# Patient Record
Sex: Female | Born: 1970 | Race: Black or African American | Hispanic: No | Marital: Married | Smoking: Never smoker
Health system: Southern US, Community
[De-identification: ages and names within clinical notes are randomized; demographics above are authoritative.]

## PROBLEM LIST (undated history)

## (undated) DIAGNOSIS — M79643 Pain in unspecified hand: Secondary | ICD-10-CM

## (undated) DIAGNOSIS — M311 Thrombotic microangiopathy: Secondary | ICD-10-CM

## (undated) DIAGNOSIS — E119 Type 2 diabetes mellitus without complications: Secondary | ICD-10-CM

## (undated) DIAGNOSIS — J45909 Unspecified asthma, uncomplicated: Secondary | ICD-10-CM

## (undated) DIAGNOSIS — I639 Cerebral infarction, unspecified: Secondary | ICD-10-CM

## (undated) DIAGNOSIS — R569 Unspecified convulsions: Secondary | ICD-10-CM

## (undated) DIAGNOSIS — I10 Essential (primary) hypertension: Secondary | ICD-10-CM

## (undated) DIAGNOSIS — N289 Disorder of kidney and ureter, unspecified: Secondary | ICD-10-CM

## (undated) DIAGNOSIS — M3119 Other thrombotic microangiopathy: Secondary | ICD-10-CM

## (undated) HISTORY — PX: TUBAL LIGATION: SHX77

## (undated) HISTORY — PX: TONSILLECTOMY: SUR1361

---

## 2002-10-18 ENCOUNTER — Emergency Department (HOSPITAL_COMMUNITY): Admission: EM | Admit: 2002-10-18 | Discharge: 2002-10-18 | Payer: Self-pay | Admitting: Emergency Medicine

## 2007-12-01 ENCOUNTER — Ambulatory Visit (HOSPITAL_COMMUNITY): Admission: EM | Admit: 2007-12-01 | Discharge: 2007-12-01 | Payer: Self-pay | Admitting: Emergency Medicine

## 2011-02-16 ENCOUNTER — Emergency Department (HOSPITAL_BASED_OUTPATIENT_CLINIC_OR_DEPARTMENT_OTHER)
Admission: EM | Admit: 2011-02-16 | Discharge: 2011-02-16 | Disposition: A | Discharge status: Home / Self Care | Payer: Medicaid - Out of State | Attending: Emergency Medicine | Admitting: Emergency Medicine

## 2011-02-16 DIAGNOSIS — E669 Obesity, unspecified: Secondary | ICD-10-CM | POA: Insufficient documentation

## 2011-02-16 DIAGNOSIS — N390 Urinary tract infection, site not specified: Secondary | ICD-10-CM | POA: Insufficient documentation

## 2011-02-16 DIAGNOSIS — R112 Nausea with vomiting, unspecified: Secondary | ICD-10-CM | POA: Insufficient documentation

## 2011-02-16 DIAGNOSIS — Z794 Long term (current) use of insulin: Secondary | ICD-10-CM | POA: Insufficient documentation

## 2011-02-16 DIAGNOSIS — E119 Type 2 diabetes mellitus without complications: Secondary | ICD-10-CM | POA: Insufficient documentation

## 2011-02-16 DIAGNOSIS — I10 Essential (primary) hypertension: Secondary | ICD-10-CM | POA: Insufficient documentation

## 2011-02-16 DIAGNOSIS — F329 Major depressive disorder, single episode, unspecified: Secondary | ICD-10-CM | POA: Insufficient documentation

## 2011-02-16 DIAGNOSIS — K5900 Constipation, unspecified: Secondary | ICD-10-CM | POA: Insufficient documentation

## 2011-02-16 DIAGNOSIS — F3289 Other specified depressive episodes: Secondary | ICD-10-CM | POA: Insufficient documentation

## 2011-02-16 DIAGNOSIS — Z79899 Other long term (current) drug therapy: Secondary | ICD-10-CM | POA: Insufficient documentation

## 2011-02-16 DIAGNOSIS — R51 Headache: Secondary | ICD-10-CM | POA: Insufficient documentation

## 2011-02-16 LAB — URINALYSIS, ROUTINE W REFLEX MICROSCOPIC
Bilirubin Urine: NEGATIVE
Nitrite: POSITIVE — AB
Protein, ur: >300 mg/dL — AB
Urobilinogen, UA: 0.2 mg/dL (ref 0.0–1.0)

## 2011-02-16 LAB — DIFFERENTIAL
Basophils Absolute: 0 10*3/uL (ref 0.0–0.1)
Lymphocytes Relative: 32 % (ref 12–46)
Monocytes Relative: 8 % (ref 3–12)
Neutrophils Relative %: 59 % (ref 43–77)

## 2011-02-16 LAB — CBC
Hemoglobin: 10.6 g/dL — ABNORMAL LOW (ref 12.0–15.0)
MCHC: 32.2 g/dL (ref 30.0–36.0)
RBC: 5.35 MIL/uL — ABNORMAL HIGH (ref 3.87–5.11)
WBC: 10 10*3/uL (ref 4.0–10.5)

## 2011-02-16 LAB — GLUCOSE, CAPILLARY: Glucose-Capillary: 389 mg/dL — ABNORMAL HIGH (ref 70–99)

## 2011-02-16 LAB — URINE MICROSCOPIC-ADD ON

## 2011-02-16 LAB — PREGNANCY, URINE: Preg Test, Ur: NEGATIVE

## 2011-02-16 LAB — BASIC METABOLIC PANEL
CO2: 25 mEq/L (ref 19–32)
Calcium: 9.1 mg/dL (ref 8.4–10.5)
Chloride: 100 mEq/L (ref 96–112)
GFR calc Af Amer: >60 mL/min (ref >60)
Potassium: 4.3 mEq/L (ref 3.5–5.1)
Sodium: 137 mEq/L (ref 135–145)

## 2011-02-18 LAB — URINE CULTURE

## 2012-10-30 ENCOUNTER — Emergency Department (HOSPITAL_BASED_OUTPATIENT_CLINIC_OR_DEPARTMENT_OTHER): Payer: Medicaid Other

## 2012-10-30 ENCOUNTER — Encounter (HOSPITAL_BASED_OUTPATIENT_CLINIC_OR_DEPARTMENT_OTHER): Payer: Self-pay | Admitting: Family Medicine

## 2012-10-30 ENCOUNTER — Emergency Department (HOSPITAL_BASED_OUTPATIENT_CLINIC_OR_DEPARTMENT_OTHER)
Admission: EM | Admit: 2012-10-30 | Discharge: 2012-10-30 | Disposition: A | Discharge status: Home / Self Care | Payer: Medicaid Other | Attending: Emergency Medicine | Admitting: Emergency Medicine

## 2012-10-30 DIAGNOSIS — E119 Type 2 diabetes mellitus without complications: Secondary | ICD-10-CM | POA: Insufficient documentation

## 2012-10-30 DIAGNOSIS — G40909 Epilepsy, unspecified, not intractable, without status epilepticus: Secondary | ICD-10-CM | POA: Insufficient documentation

## 2012-10-30 DIAGNOSIS — J45909 Unspecified asthma, uncomplicated: Secondary | ICD-10-CM | POA: Insufficient documentation

## 2012-10-30 DIAGNOSIS — Z79899 Other long term (current) drug therapy: Secondary | ICD-10-CM | POA: Insufficient documentation

## 2012-10-30 DIAGNOSIS — M311 Thrombotic microangiopathy, unspecified: Secondary | ICD-10-CM | POA: Insufficient documentation

## 2012-10-30 DIAGNOSIS — J069 Acute upper respiratory infection, unspecified: Secondary | ICD-10-CM | POA: Insufficient documentation

## 2012-10-30 DIAGNOSIS — D509 Iron deficiency anemia, unspecified: Secondary | ICD-10-CM | POA: Insufficient documentation

## 2012-10-30 DIAGNOSIS — I10 Essential (primary) hypertension: Secondary | ICD-10-CM | POA: Insufficient documentation

## 2012-10-30 DIAGNOSIS — J40 Bronchitis, not specified as acute or chronic: Secondary | ICD-10-CM | POA: Insufficient documentation

## 2012-10-30 HISTORY — DX: Essential (primary) hypertension: I10

## 2012-10-30 HISTORY — DX: Unspecified convulsions: R56.9

## 2012-10-30 HISTORY — DX: Unspecified asthma, uncomplicated: J45.909

## 2012-10-30 HISTORY — DX: Type 2 diabetes mellitus without complications: E11.9

## 2012-10-30 HISTORY — DX: Thrombotic microangiopathy: M31.1

## 2012-10-30 HISTORY — DX: Other thrombotic microangiopathy: M31.19

## 2012-10-30 LAB — BASIC METABOLIC PANEL
BUN: 9 mg/dL (ref 6–23)
CO2: 25 mEq/L (ref 19–32)
Chloride: 101 mEq/L (ref 96–112)
Creatinine, Ser: 1.1 mg/dL (ref 0.50–1.10)
Glucose, Bld: 319 mg/dL — ABNORMAL HIGH (ref 70–99)

## 2012-10-30 LAB — CBC WITH DIFFERENTIAL/PLATELET
Eosinophils Relative: 2 % (ref 0–5)
HCT: 34.5 % — ABNORMAL LOW (ref 36.0–46.0)
Lymphs Abs: 3.7 10*3/uL (ref 0.7–4.0)
MCV: 72.3 fL — ABNORMAL LOW (ref 78.0–100.0)
Monocytes Relative: 9 % (ref 3–12)
Neutro Abs: 7.7 10*3/uL (ref 1.7–7.7)
RDW: 14.2 % (ref 11.5–15.5)
WBC: 12.9 10*3/uL — ABNORMAL HIGH (ref 4.0–10.5)

## 2012-10-30 MED ORDER — ALBUTEROL SULFATE HFA 108 (90 BASE) MCG/ACT IN AERS
2.0000 | INHALATION_SPRAY | Freq: Once | RESPIRATORY_TRACT | Status: AC
  Administered 2012-10-30: 2 via RESPIRATORY_TRACT
  Filled 2012-10-30: qty 6.7

## 2012-10-30 MED ORDER — POTASSIUM CHLORIDE CRYS ER 20 MEQ PO TBCR
40.0000 meq | EXTENDED_RELEASE_TABLET | Freq: Once | ORAL | Status: AC
  Administered 2012-10-30: 40 meq via ORAL
  Filled 2012-10-30: qty 2

## 2012-10-30 MED ORDER — POTASSIUM CHLORIDE CRYS ER 20 MEQ PO TBCR: 20.0000 meq | EXTENDED_RELEASE_TABLET | Freq: Every day | ORAL | Status: AC

## 2012-10-30 MED ORDER — AZITHROMYCIN 250 MG PO TABS: ORAL_TABLET | ORAL | Status: DC

## 2012-10-30 MED ORDER — BENZONATATE 100 MG PO CAPS: 200.0000 mg | ORAL_CAPSULE | Freq: Two times a day (BID) | ORAL | Status: DC | PRN

## 2012-10-30 MED ORDER — ALBUTEROL SULFATE HFA 108 (90 BASE) MCG/ACT IN AERS: 1.0000 | INHALATION_SPRAY | Freq: Four times a day (QID) | RESPIRATORY_TRACT | Status: AC | PRN

## 2012-10-30 MED ORDER — POTASSIUM CHLORIDE 10 MEQ/100ML IV SOLN: 10.0000 meq | Freq: Once | INTRAVENOUS | Status: DC

## 2012-10-30 NOTE — ED Notes (Signed)
Pt not found in lobby. Registration staff sts pt had to go upstairs for something.

## 2012-10-30 NOTE — ED Notes (Signed)
Pt c/o cough, congestion and cold symptoms x 3 days.

## 2012-10-30 NOTE — ED Provider Notes (Signed)
History     CSN: [id number]  Arrival date & time 10/30/12  1608   First MD Initiated Contact with Patient 10/30/12 1759      Chief Complaint  Patient presents with  . Cough    (Consider location/radiation/quality/duration/timing/severity/associated sxs/prior treatment) HPI Comments: Patient with history of asthma presents with complaint of productive cough with green sputum X 3 days. Associated symptoms include congestion and 1 episode of post-tussive emesis. Patient states that she used her nebulizer last night without improvement. Denies fever or chills. Denies wheezing, CP, or SOB. Denies diarrhea or abdominal pain.   The history is provided by the patient. No language interpreter was used.    Past Medical History  Diagnosis Date  . Asthma   . Diabetes mellitus without complication   . Hypertension   . Seizures   . T.T.P. syndrome     Past Surgical History  Procedure Date  . Tubal ligation   . Tonsillectomy     No family history on file.  History  Substance Use Topics  . Smoking status: Never Smoker   . Smokeless tobacco: Not on file  . Alcohol Use: No    OB History    Grav Para Term Preterm Abortions TAB SAB Ect Mult Living                  Review of Systems  Constitutional: Negative for fever and chills.  HENT: Positive for congestion and sore throat. Negative for ear pain and rhinorrhea.   Eyes: Negative for discharge and redness.  Respiratory: Positive for cough. Negative for wheezing.   Gastrointestinal: Positive for vomiting. Negative for nausea and diarrhea.  Skin: Negative for rash.    Allergies  Vicodin  Home Medications   Current Outpatient Rx  Name  Route  Sig  Dispense  Refill  . ALBUTEROL SULFATE (2.5 MG/3ML) 0.083% IN NEBU   Nebulization   Take 2.5 mg by nebulization every 6 (six) hours as needed.         Marland Kitchen FERROUS FUMARATE 325 (106 FE) MG PO TABS   Oral   Take 1 tablet by mouth.         . FOLIC ACID 1 MG PO TABS    Oral   Take 1 mg by mouth daily.         Marland Kitchen NEURONTIN PO   Oral   Take by mouth.         Marland Kitchen GLUCOTROL PO   Oral   Take by mouth.         . INSULIN DETEMIR 100 UNIT/ML [place] SOLN   Subcutaneous   Inject into the skin at bedtime.         Marland Kitchen LISINOPRIL PO   Oral   Take by mouth.         Marland Kitchen PHENOBARBITAL PO   Oral   Take by mouth.         Marland Kitchen PREDNISONE (PAK) PO   Oral   Take by mouth.         Marland Kitchen PROPRANOLOL HCL 10 MG PO TABS   Oral   Take 10 mg by mouth 3 (three) times daily.         Marland Kitchen VITAMIN D (ERGOCALCIFEROL) 50000 UNITS PO CAPS   Oral   Take 50,000 Units by mouth.         . WARFARIN SODIUM PO   Oral   Take by mouth.           BP  186/94  Pulse 100  Temp 98.4 F (36.9 C) (Oral)  Resp 18  Ht 5\' 7"  (1.702 m)  Wt 241 lb (109.317 kg)  BMI 37.75 kg/m2  SpO2 97%  LMP 10/20/2012  Physical Exam  Nursing note and vitals reviewed. Constitutional: She appears well-developed and well-nourished.  HENT:  Head: Normocephalic and atraumatic.  Mouth/Throat: Oropharynx is clear and moist. No oropharyngeal exudate.  Eyes: Conjunctivae normal are normal. No scleral icterus.  Neck: Normal range of motion. Neck supple.  Cardiovascular: Normal rate, regular rhythm and normal heart sounds.   Pulmonary/Chest: Effort normal. No respiratory distress. She has no wheezes. She has rales.       Adventitious breath sounds appreciated in the RLL.  Abdominal: Soft. Bowel sounds are normal. There is no tenderness.  Lymphadenopathy:    She has no cervical adenopathy.  Neurological: She is alert.  Skin: Skin is warm and dry.    ED Course  Procedures (including critical care time)  Labs Reviewed - No data to display Dg Chest 2 View  10/30/2012  *RADIOLOGY REPORT*  Clinical Data: Cough, congestion and shortness of breath.  CHEST - 2 VIEW  Comparison: None.  Findings: Trachea is midline.  Heart size normal.  Lungs are clear. Image quality on the lateral view is degraded  by motion.  No definite pleural fluid.  IMPRESSION: No definite acute findings within the limits of exam technique.   Original Report Authenticated By: Lorin Picket, M.D.      1. URI (upper respiratory infection)   2. Bronchitis   3. Microcytic anemia       MDM  Patient presented with complaint of URI symptoms. Patient asthmatic and out of inhaler and neb. Refills for these medications provided and patient given inhaler in ED. CBC: remarkable for leukocytosis and microcytic anemia. BMP: remarkable for hypokalemia possibly due to frequent use of nebs. Patient reports history or hypokalemia. Patient given replacement in ED and discharged on potassium for tomorrow with instructions to have K+ rechecked. CXR: unremarkable. Given patient's PE findings, discharged on Z-pak for bronchitis and instructions to use her inhaler. Return precautions given verbally and in discharge summary.       Annalee Genta, PA-C 10/30/12 2257

## 2012-10-31 NOTE — ED Provider Notes (Signed)
Medical screening examination/treatment/procedure(s) were performed by non-physician practitioner and as supervising physician I was immediately available for consultation/collaboration.   Malvin Johns, MD 10/31/12 [phone]

## 2013-02-12 ENCOUNTER — Emergency Department (HOSPITAL_BASED_OUTPATIENT_CLINIC_OR_DEPARTMENT_OTHER)
Admission: EM | Admit: 2013-02-12 | Discharge: 2013-02-12 | Disposition: A | Discharge status: Home / Self Care | Payer: Medicaid Other | Attending: Emergency Medicine | Admitting: Emergency Medicine

## 2013-02-12 ENCOUNTER — Encounter (HOSPITAL_BASED_OUTPATIENT_CLINIC_OR_DEPARTMENT_OTHER): Payer: Self-pay | Admitting: *Deleted

## 2013-02-12 DIAGNOSIS — M6530 Trigger finger, unspecified finger: Secondary | ICD-10-CM | POA: Insufficient documentation

## 2013-02-12 DIAGNOSIS — Z8669 Personal history of other diseases of the nervous system and sense organs: Secondary | ICD-10-CM | POA: Insufficient documentation

## 2013-02-12 DIAGNOSIS — I10 Essential (primary) hypertension: Secondary | ICD-10-CM | POA: Insufficient documentation

## 2013-02-12 DIAGNOSIS — M7989 Other specified soft tissue disorders: Secondary | ICD-10-CM | POA: Insufficient documentation

## 2013-02-12 DIAGNOSIS — M311 Thrombotic microangiopathy, unspecified: Secondary | ICD-10-CM | POA: Insufficient documentation

## 2013-02-12 DIAGNOSIS — E119 Type 2 diabetes mellitus without complications: Secondary | ICD-10-CM | POA: Insufficient documentation

## 2013-02-12 DIAGNOSIS — Z8673 Personal history of transient ischemic attack (TIA), and cerebral infarction without residual deficits: Secondary | ICD-10-CM | POA: Insufficient documentation

## 2013-02-12 DIAGNOSIS — Z7901 Long term (current) use of anticoagulants: Secondary | ICD-10-CM | POA: Insufficient documentation

## 2013-02-12 DIAGNOSIS — IMO0002 Reserved for concepts with insufficient information to code with codable children: Secondary | ICD-10-CM | POA: Insufficient documentation

## 2013-02-12 DIAGNOSIS — Z79899 Other long term (current) drug therapy: Secondary | ICD-10-CM | POA: Insufficient documentation

## 2013-02-12 DIAGNOSIS — J45909 Unspecified asthma, uncomplicated: Secondary | ICD-10-CM | POA: Insufficient documentation

## 2013-02-12 DIAGNOSIS — Z794 Long term (current) use of insulin: Secondary | ICD-10-CM | POA: Insufficient documentation

## 2013-02-12 HISTORY — DX: Cerebral infarction, unspecified: I63.9

## 2013-02-12 HISTORY — DX: Pain in unspecified hand: M79.643

## 2013-02-12 MED ORDER — IBUPROFEN 400 MG PO TABS
600.0000 mg | ORAL_TABLET | Freq: Once | ORAL | Status: AC
  Administered 2013-02-12: 600 mg via ORAL
  Filled 2013-02-12: qty 1

## 2013-02-12 MED ORDER — IBUPROFEN 400 MG PO TABS: 400.0000 mg | ORAL_TABLET | Freq: Four times a day (QID) | ORAL | Status: AC | PRN

## 2013-02-12 MED ORDER — OXYCODONE-ACETAMINOPHEN 5-325 MG PO TABS: 1.0000 | ORAL_TABLET | ORAL | Status: AC | PRN

## 2013-02-12 NOTE — ED Notes (Signed)
Pt reports she has been getting injections in her left hand- can't see ortho doctor at this time- states hand is "swelling and sticking"

## 2013-02-12 NOTE — ED Provider Notes (Signed)
History    This chart was scribed for Varney Biles, MD by Adriana Reams, ED Scribe. This patient was seen in room MH05/MH05 and the patient's care was started at 1520.   CSN: [id number]  Arrival date & time 02/12/13  1411   None     Chief Complaint  Patient presents with  . Hand Pain     The history is provided by the patient. No language interpreter was used.   Crystal Bradshaw is a 42 y.o. female who presents to the Emergency Department complaining of gradual onset, constant, non-changing chronic left hand pain which began one month ago. She reports she has been receiving cortisone shots in her left hand for a trigger thumb. She gets these shots about once or twice a year. She couldn't see her orthopedic MD because she lost her Medicare card and can't get an appointment without it for up to 3 weeks. She reports associated swelling. She denies any other pain.She has tried [employer] and ice with little relief.  Past Medical History  Diagnosis Date  . Asthma   . Diabetes mellitus without complication   . Hypertension   . T.T.P. syndrome   . Seizures     last seizure Dec 2013  . Hand pain   . Stroke     Past Surgical History  Procedure Laterality Date  . Tubal ligation    . Tonsillectomy      No family history on file.  History  Substance Use Topics  . Smoking status: Never Smoker   . Smokeless tobacco: Never Used  . Alcohol Use: No    OB History   Grav Para Term Preterm Abortions TAB SAB Ect Mult Living                  Review of Systems  Constitutional: Negative for fever.  Respiratory: Negative for cough.   Gastrointestinal: Negative for nausea.  Musculoskeletal: Positive for arthralgias.  All other systems reviewed and are negative.    Allergies  Vicodin  Home Medications   Current Outpatient Rx  Name  Route  Sig  Dispense  Refill  . albuterol (PROVENTIL HFA;VENTOLIN HFA) 108 (90 BASE) MCG/ACT inhaler   Inhalation   Inhale 1-2 puffs into the lungs  every 6 (six) hours as needed for wheezing.   1 Inhaler   3   . albuterol (PROVENTIL) (2.5 MG/3ML) 0.083% nebulizer solution   Nebulization   Take 2.5 mg by nebulization every 6 (six) hours as needed.         . ferrous fumarate (HEMOCYTE - 106 MG FE) 325 (106 FE) MG TABS   Oral   Take 1 tablet by mouth.         . folic acid (FOLVITE) 1 MG tablet   Oral   Take 1 mg by mouth daily.         . Gabapentin (NEURONTIN PO)   Oral   Take by mouth.         . GlipiZIDE (GLUCOTROL PO)   Oral   Take by mouth.         . insulin detemir (LEVEMIR) 100 UNIT/ML injection   Subcutaneous   Inject into the skin at bedtime.         Marland Kitchen LISINOPRIL PO   Oral   Take by mouth.         . metoprolol succinate (TOPROL-XL) 25 MG 24 hr tablet   Oral   Take 25 mg by mouth daily.         Marland Kitchen  PHENOBARBITAL PO   Oral   Take by mouth.         . potassium chloride SA (K-DUR,KLOR-CON) 20 MEQ tablet   Oral   Take 1 tablet (20 mEq total) by mouth daily. Take two tablets once.   2 tablet   0   . propranolol (INDERAL) 10 MG tablet   Oral   Take 10 mg by mouth 3 (three) times daily.         . Vitamin D, Ergocalciferol, (DRISDOL) 50000 UNITS CAPS   Oral   Take 50,000 Units by mouth.         . WARFARIN SODIUM PO   Oral   Take by mouth.         Marland Kitchen azithromycin (ZITHROMAX Z-PAK) 250 MG tablet      2 po day one, then 1 daily x 4 days   5 tablet   0   . benzonatate (TESSALON) 100 MG capsule   Oral   Take 2 capsules (200 mg total) by mouth 2 (two) times daily as needed for cough.   20 capsule   0   . PREDNISONE, PAK, PO   Oral   Take by mouth.           BP 210/99  Pulse 94  Temp(Src) 98.1 F (36.7 C)  Resp 20  SpO2 100%  LMP 02/06/2013  Physical Exam  Nursing note and vitals reviewed. Constitutional: She is oriented to person, place, and time. She appears well-developed and well-nourished. No distress.  HENT:  Head: Normocephalic and atraumatic.  Eyes: EOM  are normal.  Neck: Neck supple. No tracheal deviation present.  Cardiovascular: Normal rate, regular rhythm and normal heart sounds.   Pulmonary/Chest: Effort normal and breath sounds normal. No respiratory distress.  Musculoskeletal: Normal range of motion.  Left hand on thumb has minimal swelling. Appreciable tenderness with palpitation. Pt has trigger thumb with IP joint involved.  Neurological: She is alert and oriented to person, place, and time.  Skin: Skin is warm and dry.  Psychiatric: She has a normal mood and affect. Her behavior is normal.    ED Course  Procedures (including critical care time) DIAGNOSTIC STUDIES: Oxygen Saturation is 100% on room air, normal by my interpretation.    COORDINATION OF CARE: 3:28 PM Discussed treatment plan which includes pain management with pt at bedside and pt agreed to plan.     Labs Reviewed - No data to display No results found.   No diagnosis found.    MDM  I personally performed the services described in this documentation, which was scribed in my presence. The recorded information has been reviewed and is accurate.  Pt comes in with cc of hand pain. Our exam shows no findings consistent with trigger finger. Pt trying to establish an orthopedic doctor here, as this is a known condition. Will give pain meds, discharge.        Varney Biles, MD 02/12/13 [phone]

## 2014-02-02 ENCOUNTER — Emergency Department (HOSPITAL_BASED_OUTPATIENT_CLINIC_OR_DEPARTMENT_OTHER)
Admission: EM | Admit: 2014-02-02 | Discharge: 2014-02-02 | Disposition: A | Discharge status: Home / Self Care | Payer: Medicaid - Out of State | Attending: Emergency Medicine | Admitting: Emergency Medicine

## 2014-02-02 ENCOUNTER — Encounter (HOSPITAL_BASED_OUTPATIENT_CLINIC_OR_DEPARTMENT_OTHER): Payer: Self-pay | Admitting: Emergency Medicine

## 2014-02-02 DIAGNOSIS — Y92009 Unspecified place in unspecified non-institutional (private) residence as the place of occurrence of the external cause: Secondary | ICD-10-CM | POA: Insufficient documentation

## 2014-02-02 DIAGNOSIS — I10 Essential (primary) hypertension: Secondary | ICD-10-CM | POA: Insufficient documentation

## 2014-02-02 DIAGNOSIS — R21 Rash and other nonspecific skin eruption: Secondary | ICD-10-CM | POA: Insufficient documentation

## 2014-02-02 DIAGNOSIS — E119 Type 2 diabetes mellitus without complications: Secondary | ICD-10-CM | POA: Insufficient documentation

## 2014-02-02 DIAGNOSIS — G40909 Epilepsy, unspecified, not intractable, without status epilepticus: Secondary | ICD-10-CM | POA: Insufficient documentation

## 2014-02-02 DIAGNOSIS — Z8673 Personal history of transient ischemic attack (TIA), and cerebral infarction without residual deficits: Secondary | ICD-10-CM | POA: Insufficient documentation

## 2014-02-02 DIAGNOSIS — R000 Tachycardia, unspecified: Secondary | ICD-10-CM | POA: Insufficient documentation

## 2014-02-02 DIAGNOSIS — Z7901 Long term (current) use of anticoagulants: Secondary | ICD-10-CM | POA: Insufficient documentation

## 2014-02-02 DIAGNOSIS — Z79899 Other long term (current) drug therapy: Secondary | ICD-10-CM | POA: Insufficient documentation

## 2014-02-02 DIAGNOSIS — Z91040 Latex allergy status: Secondary | ICD-10-CM | POA: Insufficient documentation

## 2014-02-02 DIAGNOSIS — IMO0002 Reserved for concepts with insufficient information to code with codable children: Secondary | ICD-10-CM | POA: Insufficient documentation

## 2014-02-02 DIAGNOSIS — H5789 Other specified disorders of eye and adnexa: Secondary | ICD-10-CM | POA: Insufficient documentation

## 2014-02-02 DIAGNOSIS — Y9389 Activity, other specified: Secondary | ICD-10-CM | POA: Insufficient documentation

## 2014-02-02 DIAGNOSIS — H53149 Visual discomfort, unspecified: Secondary | ICD-10-CM | POA: Insufficient documentation

## 2014-02-02 DIAGNOSIS — T63481A Toxic effect of venom of other arthropod, accidental (unintentional), initial encounter: Secondary | ICD-10-CM

## 2014-02-02 DIAGNOSIS — J45909 Unspecified asthma, uncomplicated: Secondary | ICD-10-CM | POA: Insufficient documentation

## 2014-02-02 DIAGNOSIS — Z794 Long term (current) use of insulin: Secondary | ICD-10-CM | POA: Insufficient documentation

## 2014-02-02 HISTORY — DX: Thrombotic microangiopathy: M31.1

## 2014-02-02 HISTORY — DX: Other thrombotic microangiopathy: M31.19

## 2014-02-02 LAB — GLUCOSE, CAPILLARY: GLUCOSE-CAPILLARY: 155 mg/dL — AB (ref 70–99)

## 2014-02-02 MED ORDER — PREDNISONE 10 MG PO TABS: ORAL_TABLET | ORAL | Status: DC

## 2014-02-02 MED ORDER — FAMOTIDINE 20 MG PO TABS: 20.0000 mg | ORAL_TABLET | Freq: Two times a day (BID) | ORAL | Status: AC

## 2014-02-02 MED ORDER — DIPHENHYDRAMINE HCL 25 MG PO CAPS
50.0000 mg | ORAL_CAPSULE | Freq: Once | ORAL | Status: AC
  Administered 2014-02-02: 50 mg via ORAL
  Filled 2014-02-02: qty 2

## 2014-02-02 MED ORDER — DIPHENHYDRAMINE HCL 25 MG PO TABS: 25.0000 mg | ORAL_TABLET | Freq: Four times a day (QID) | ORAL | Status: DC

## 2014-02-02 MED ORDER — METOPROLOL SUCCINATE ER 25 MG PO TB24
25.0000 mg | ORAL_TABLET | Freq: Every day | ORAL | Status: DC
  Filled 2014-02-02: qty 1

## 2014-02-02 MED ORDER — PREDNISONE 50 MG PO TABS
60.0000 mg | ORAL_TABLET | Freq: Once | ORAL | Status: AC
  Administered 2014-02-02: 60 mg via ORAL
  Filled 2014-02-02 (×2): qty 1

## 2014-02-02 MED ORDER — FAMOTIDINE 20 MG PO TABS
20.0000 mg | ORAL_TABLET | Freq: Once | ORAL | Status: AC
  Administered 2014-02-02: 20 mg via ORAL
  Filled 2014-02-02: qty 1

## 2014-02-02 NOTE — ED Provider Notes (Signed)
Medical screening examination/treatment/procedure(s) were performed by non-physician practitioner and as supervising physician I was immediately available for consultation/collaboration.  EKG Interpretation   None        Threasa Beards, MD 02/02/14 1324

## 2014-02-02 NOTE — ED Notes (Signed)
Attempted to contact patient to review disposition. Patients primary number and emergency contact number were both disconnected.

## 2014-02-02 NOTE — ED Notes (Addendum)
Patient spent the night at family members house. Woke up appx 4am and was itching and had what she feels was small bites all over. States that she went home and took a shower, took some benadryl and went to bed. When she woke up her left eye was swollen almost shut and it itches. Patients father passed away this week and she feels her BP is due to the stress.

## 2014-02-02 NOTE — Discharge Instructions (Signed)
Happy Valentines Day. Sorry you are having an allergic reaction that has caused swelling of your upper eye lid. Continue to apply ice, rest and take the medications as directed. Return for worsening symptoms. Your blood pressure is elevated today be sure to take your medication as directed and check you blood pressure at home. If it continues to be elevated contact your doctor to discuss your medication. If you develop symptoms such as severe headache, change in vision or other problems, return here immediately.   I am sorry to hear about the loss of your father. I will be thinking about you and your family.

## 2014-02-02 NOTE — ED Provider Notes (Signed)
CSN: [id number]     Arrival date & time 02/02/14  1127 History   First MD Initiated Contact with Patient 02/02/14 1158     Chief Complaint  Patient presents with  . Eye Problem     (Consider location/radiation/quality/duration/timing/severity/associated sxs/prior Treatment) Patient is a 43 y.o. female presenting with eye problem. The history is provided by the patient.  Eye Problem Location:  L eye Quality: itching. Severity:  Moderate Onset quality:  Gradual Duration:  8 hours Timing:  Constant Progression:  Worsening Chronicity:  New Relieved by:  Nothing Ineffective treatments:  Ice (benadryl) Associated symptoms: itching, photophobia, redness and swelling   Associated symptoms: no blurred vision, no double vision, no headaches, no nausea and no vomiting   Risk factors: not exposed to pinkeye, no recent herpes zoster and no recent URI    Crystal Bradshaw is a 43 y.o. female who presents to the ED with eye itching and swelling that started early this morning. She states she was at her father's house and went to sleep. She woke up about 4 am and noted what she thought was insect bites all over body. Mostly arms and face. She went home took a shower, took Benadryl and put an ice pack over the left eye. She woke again about 11:30 am she woke and her arms and face were much better but she had more swelling and pressure feeling around her left eye. Last took Benadryl 50 mg at 5:30 am. She denies foreign body sensation but does have photophobia.    Past Medical History  Diagnosis Date  . Asthma   . Diabetes mellitus without complication   . Hypertension   . T.T.P. syndrome   . Seizures     last seizure Dec 2013  . Hand pain   . Stroke   . TTP (thrombotic thrombocytopenic purpura)    Past Surgical History  Procedure Laterality Date  . Tubal ligation    . Tonsillectomy     No family history on file. History  Substance Use Topics  . Smoking status: Never Smoker   . Smokeless  tobacco: Never Used  . Alcohol Use: No   OB History   Grav Para Term Preterm Abortions TAB SAB Ect Mult Living                 Review of Systems  Constitutional: Negative for fever and chills.  HENT: Negative.   Eyes: Positive for photophobia, redness and itching. Negative for blurred vision and double vision.  Respiratory: Negative for cough and shortness of breath.   Gastrointestinal: Negative for nausea and vomiting.  Genitourinary: Negative for urgency and frequency.  Skin: Positive for rash.  Neurological: Negative for syncope and headaches.  Psychiatric/Behavioral: Negative for confusion. The patient is not nervous/anxious.       Allergies  Latex and Vicodin  Home Medications   Current Outpatient Rx  Name  Route  Sig  Dispense  Refill  . levothyroxine (SYNTHROID, LEVOTHROID) 100 MCG tablet   Oral   Take 100 mcg by mouth daily before breakfast.         . ondansetron (ZOFRAN) 8 MG tablet   Oral   Take by mouth every 8 (eight) hours as needed for nausea or vomiting.         Marland Kitchen albuterol (PROVENTIL HFA;VENTOLIN HFA) 108 (90 BASE) MCG/ACT inhaler   Inhalation   Inhale 1-2 puffs into the lungs every 6 (six) hours as needed for wheezing.   1 Inhaler  3   . albuterol (PROVENTIL) (2.5 MG/3ML) 0.083% nebulizer solution   Nebulization   Take 2.5 mg by nebulization every 6 (six) hours as needed.         Marland Kitchen azithromycin (ZITHROMAX Z-PAK) 250 MG tablet      2 po day one, then 1 daily x 4 days   5 tablet   0   . benzonatate (TESSALON) 100 MG capsule   Oral   Take 2 capsules (200 mg total) by mouth 2 (two) times daily as needed for cough.   20 capsule   0   . ferrous fumarate (HEMOCYTE - 106 MG FE) 325 (106 FE) MG TABS   Oral   Take 1 tablet by mouth.         . folic acid (FOLVITE) 1 MG tablet   Oral   Take 1 mg by mouth daily.         . Gabapentin (NEURONTIN PO)   Oral   Take by mouth.         . GlipiZIDE (GLUCOTROL PO)   Oral   Take by  mouth.         Marland Kitchen ibuprofen (ADVIL,MOTRIN) 400 MG tablet   Oral   Take 1 tablet (400 mg total) by mouth every 6 (six) hours as needed for pain.   20 tablet   0   . insulin detemir (LEVEMIR) 100 UNIT/ML injection   Subcutaneous   Inject into the skin at bedtime.         Marland Kitchen LISINOPRIL PO   Oral   Take by mouth.         . metoprolol succinate (TOPROL-XL) 25 MG 24 hr tablet   Oral   Take 25 mg by mouth daily.         Marland Kitchen oxyCODONE-acetaminophen (PERCOCET/ROXICET) 5-325 MG per tablet   Oral   Take 1 tablet by mouth every 4 (four) hours as needed for pain.   15 tablet   0   . PHENOBARBITAL PO   Oral   Take by mouth.         . potassium chloride SA (K-DUR,KLOR-CON) 20 MEQ tablet   Oral   Take 1 tablet (20 mEq total) by mouth daily. Take two tablets once.   2 tablet   0   . PREDNISONE, PAK, PO   Oral   Take by mouth.         . propranolol (INDERAL) 10 MG tablet   Oral   Take 10 mg by mouth 3 (three) times daily.         . Vitamin D, Ergocalciferol, (DRISDOL) 50000 UNITS CAPS   Oral   Take 50,000 Units by mouth.         . WARFARIN SODIUM PO   Oral   Take by mouth.          BP 213/120  Pulse 106  Temp(Src) 97.8 F (36.6 C) (Oral)  Resp 24  Ht 5\' 7"  (1.702 m)  Wt 241 lb (109.317 kg)  BMI 37.74 kg/m2  SpO2 98% Physical Exam  Nursing note and vitals reviewed. Constitutional: She is oriented to person, place, and time. She appears well-developed and well-nourished.  Elevated BP, patient has not taken her BP medication today and thinks the elevation today is due to stress of loss of her father 2 days ago.  HENT:  Head: Normocephalic and atraumatic.  Right Ear: Tympanic membrane normal.  Left Ear: Tympanic membrane normal.  Nose: Nose normal.  Mouth/Throat: Uvula is midline, oropharynx is clear and moist and mucous membranes are normal.  Eyes: EOM are normal. Pupils are equal, round, and reactive to light. Right conjunctiva is not injected. Left  conjunctiva is not injected. Right eye exhibits normal extraocular motion. Left eye exhibits normal extraocular motion.  Left upper lid with edema, non tender, no erythema.   Neck: Neck supple.  Cardiovascular: Regular rhythm.  Tachycardia present.   Pulmonary/Chest: Effort normal. She has no wheezes. She has no rales.  Musculoskeletal: Normal range of motion.  Neurological: She is alert and oriented to person, place, and time. No cranial nerve deficit.  Skin: Skin is warm and dry.  Psychiatric: She has a normal mood and affect. Her behavior is normal.   ED Course  Procedures BP 194/105  Pulse 106  Temp(Src) 97.8 F (36.6 C) (Oral)  Resp 24  Ht 5\' 7"  (1.702 m)  Wt 241 lb (109.317 kg)  BMI 37.74 kg/m2  SpO2 98%  Results for orders placed during the hospital encounter of 02/02/14 (from the past 24 hour(s))  GLUCOSE, CAPILLARY     Status: Abnormal   Collection Time    02/02/14 12:47 PM      Result Value Ref Range   Glucose-Capillary 155 (*) 70 - 99 mg/dL   Comment 1 Notify RN     Comment 2 Documented in Chart      Patient given Benadryl 50 mg, Pepcid 20 mg and Prednisone 60 mg PO here in the ED. Patient also given her does of BP medication that was due.   I discussed this case with Dr. Canary Brim.  MDM Re examined after medications and ice to affected area. Swelling of eyelid has decreased and patient feeling better   43 y.o. female with swelling of the left upper eye lid consistent with allergic reaction. No wheezing, shortness of breath, swelling of throat or other signs of systemic allergic reaction. Discussed elevated BP and need for follow up.  Stable for discharge without any immediate complications.    Medication List    STOP taking these medications       PREDNISONE (PAK) PO  Replaced by:  predniSONE 10 MG tablet     propranolol 10 MG tablet  Commonly known as:  INDERAL      TAKE these medications       diphenhydrAMINE 25 MG tablet  Commonly known as:  BENADRYL   Take 1 tablet (25 mg total) by mouth every 6 (six) hours.     famotidine 20 MG tablet  Commonly known as:  PEPCID  Take 1 tablet (20 mg total) by mouth 2 (two) times daily.     predniSONE 10 MG tablet  Commonly known as:  DELTASONE  Starting tomorrow 02/03/3014 take 20 mg PO BID      ASK your doctor about these medications       albuterol (2.5 MG/3ML) 0.083% nebulizer solution  Commonly known as:  PROVENTIL  Take 2.5 mg by nebulization every 6 (six) hours as needed.     albuterol 108 (90 BASE) MCG/ACT inhaler  Commonly known as:  PROVENTIL HFA;VENTOLIN HFA  Inhale 1-2 puffs into the lungs every 6 (six) hours as needed for wheezing.     azithromycin 250 MG tablet  Commonly known as:  ZITHROMAX Z-PAK  2 po day one, then 1 daily x 4 days     benzonatate 100 MG capsule  Commonly known as:  TESSALON  Take 2 capsules (200 mg total) by mouth 2 (  two) times daily as needed for cough.     ferrous fumarate 325 (106 FE) MG Tabs tablet  Commonly known as:  HEMOCYTE - 106 mg FE  Take 1 tablet by mouth.     folic acid 1 MG tablet  Commonly known as:  FOLVITE  Take 1 mg by mouth daily.     GLUCOTROL PO  Take by mouth.     ibuprofen 400 MG tablet  Commonly known as:  ADVIL,MOTRIN  Take 1 tablet (400 mg total) by mouth every 6 (six) hours as needed for pain.     insulin detemir 100 UNIT/ML injection  Commonly known as:  LEVEMIR  Inject into the skin at bedtime.     levothyroxine 100 MCG tablet  Commonly known as:  SYNTHROID, LEVOTHROID  Take 100 mcg by mouth daily before breakfast.     LISINOPRIL PO  Take by mouth.     metoprolol succinate 25 MG 24 hr tablet  Commonly known as:  TOPROL-XL  Take 25 mg by mouth daily.     NEURONTIN PO  Take by mouth.     ondansetron 8 MG tablet  Commonly known as:  ZOFRAN  Take by mouth every 8 (eight) hours as needed for nausea or vomiting.     oxyCODONE-acetaminophen 5-325 MG per tablet  Commonly known as:  PERCOCET/ROXICET  Take 1  tablet by mouth every 4 (four) hours as needed for pain.     PHENOBARBITAL PO  Take by mouth.     potassium chloride SA 20 MEQ tablet  Commonly known as:  K-DUR,KLOR-CON  Take 1 tablet (20 mEq total) by mouth daily. Take two tablets once.     Vitamin D (Ergocalciferol) 50000 UNITS Caps capsule  Commonly known as:  DRISDOL  Take 50,000 Units by mouth.     WARFARIN SODIUM PO  Take by mouth.         [place], [place] 02/02/14 1323

## 2014-02-02 NOTE — ED Notes (Addendum)
NP at bedside.

## 2014-02-08 ENCOUNTER — Emergency Department (HOSPITAL_BASED_OUTPATIENT_CLINIC_OR_DEPARTMENT_OTHER)
Admission: EM | Admit: 2014-02-08 | Discharge: 2014-02-08 | Disposition: A | Discharge status: Home / Self Care | Payer: Medicaid - Out of State | Attending: Emergency Medicine | Admitting: Emergency Medicine

## 2014-02-08 ENCOUNTER — Encounter (HOSPITAL_BASED_OUTPATIENT_CLINIC_OR_DEPARTMENT_OTHER): Payer: Self-pay | Admitting: Emergency Medicine

## 2014-02-08 DIAGNOSIS — Z79899 Other long term (current) drug therapy: Secondary | ICD-10-CM | POA: Insufficient documentation

## 2014-02-08 DIAGNOSIS — H11429 Conjunctival edema, unspecified eye: Secondary | ICD-10-CM | POA: Insufficient documentation

## 2014-02-08 DIAGNOSIS — T7840XA Allergy, unspecified, initial encounter: Secondary | ICD-10-CM

## 2014-02-08 DIAGNOSIS — H02849 Edema of unspecified eye, unspecified eyelid: Secondary | ICD-10-CM | POA: Insufficient documentation

## 2014-02-08 DIAGNOSIS — E119 Type 2 diabetes mellitus without complications: Secondary | ICD-10-CM | POA: Insufficient documentation

## 2014-02-08 DIAGNOSIS — R220 Localized swelling, mass and lump, head: Secondary | ICD-10-CM | POA: Insufficient documentation

## 2014-02-08 DIAGNOSIS — Z91040 Latex allergy status: Secondary | ICD-10-CM | POA: Insufficient documentation

## 2014-02-08 DIAGNOSIS — IMO0002 Reserved for concepts with insufficient information to code with codable children: Secondary | ICD-10-CM | POA: Insufficient documentation

## 2014-02-08 DIAGNOSIS — I10 Essential (primary) hypertension: Secondary | ICD-10-CM | POA: Insufficient documentation

## 2014-02-08 DIAGNOSIS — Z8673 Personal history of transient ischemic attack (TIA), and cerebral infarction without residual deficits: Secondary | ICD-10-CM | POA: Insufficient documentation

## 2014-02-08 DIAGNOSIS — H579 Unspecified disorder of eye and adnexa: Secondary | ICD-10-CM | POA: Insufficient documentation

## 2014-02-08 DIAGNOSIS — H5789 Other specified disorders of eye and adnexa: Secondary | ICD-10-CM | POA: Insufficient documentation

## 2014-02-08 DIAGNOSIS — R221 Localized swelling, mass and lump, neck: Principal | ICD-10-CM

## 2014-02-08 DIAGNOSIS — J45909 Unspecified asthma, uncomplicated: Secondary | ICD-10-CM | POA: Insufficient documentation

## 2014-02-08 DIAGNOSIS — Z7901 Long term (current) use of anticoagulants: Secondary | ICD-10-CM | POA: Insufficient documentation

## 2014-02-08 DIAGNOSIS — Z794 Long term (current) use of insulin: Secondary | ICD-10-CM | POA: Insufficient documentation

## 2014-02-08 LAB — CBG MONITORING, ED: Glucose-Capillary: 168 mg/dL — ABNORMAL HIGH (ref 70–99)

## 2014-02-08 MED ORDER — FAMOTIDINE 20 MG PO TABS: 20.0000 mg | ORAL_TABLET | Freq: Two times a day (BID) | ORAL | Status: AC

## 2014-02-08 MED ORDER — EPINEPHRINE 0.3 MG/0.3ML IJ SOAJ: 0.3000 mg | INTRAMUSCULAR | Status: AC | PRN

## 2014-02-08 MED ORDER — DIPHENHYDRAMINE HCL 50 MG/ML IJ SOLN
INTRAMUSCULAR | Status: AC
  Administered 2014-02-08: 25 mg via INTRAVENOUS
  Filled 2014-02-08: qty 1

## 2014-02-08 MED ORDER — METOPROLOL TARTRATE 50 MG PO TABS
25.0000 mg | ORAL_TABLET | Freq: Once | ORAL | Status: AC
  Administered 2014-02-08: 25 mg via ORAL
  Filled 2014-02-08: qty 1

## 2014-02-08 MED ORDER — METHYLPREDNISOLONE SODIUM SUCC 125 MG IJ SOLR
125.0000 mg | Freq: Once | INTRAMUSCULAR | Status: AC
  Administered 2014-02-08: 125 mg via INTRAVENOUS

## 2014-02-08 MED ORDER — TETRACAINE HCL 0.5 % OP SOLN
2.0000 [drp] | Freq: Once | OPHTHALMIC | Status: AC
  Administered 2014-02-08: 2 [drp] via OPHTHALMIC
  Filled 2014-02-08: qty 2

## 2014-02-08 MED ORDER — SODIUM CHLORIDE 0.9 % IV BOLUS (SEPSIS): 1000.0000 mL | Freq: Once | INTRAVENOUS | Status: DC

## 2014-02-08 MED ORDER — PREDNISONE 50 MG PO TABS: ORAL_TABLET | ORAL | Status: DC

## 2014-02-08 MED ORDER — DIPHENHYDRAMINE HCL 25 MG PO CAPS
25.0000 mg | ORAL_CAPSULE | Freq: Once | ORAL | Status: AC
  Administered 2014-02-08: 25 mg via ORAL
  Filled 2014-02-08: qty 1

## 2014-02-08 MED ORDER — FLUORESCEIN SODIUM 1 MG OP STRP
1.0000 | ORAL_STRIP | Freq: Once | OPHTHALMIC | Status: AC
  Administered 2014-02-08: 1 via OPHTHALMIC
  Filled 2014-02-08: qty 1

## 2014-02-08 MED ORDER — DIPHENHYDRAMINE HCL 50 MG/ML IJ SOLN
25.0000 mg | Freq: Once | INTRAMUSCULAR | Status: AC
  Administered 2014-02-08: 25 mg via INTRAVENOUS
  Filled 2014-02-08: qty 1

## 2014-02-08 MED ORDER — EPINEPHRINE HCL 1 MG/ML IJ SOLN
INTRAMUSCULAR | Status: AC
  Administered 2014-02-08: 0.3 mg
  Filled 2014-02-08: qty 1

## 2014-02-08 MED ORDER — EPINEPHRINE 0.3 MG/0.3ML IJ SOAJ
0.3000 mg | Freq: Once | INTRAMUSCULAR | Status: AC
  Filled 2014-02-08: qty 0.6

## 2014-02-08 MED ORDER — DIPHENHYDRAMINE HCL 25 MG PO TABS: 25.0000 mg | ORAL_TABLET | Freq: Four times a day (QID) | ORAL | Status: AC

## 2014-02-08 MED ORDER — PREDNISONE 50 MG PO TABS
60.0000 mg | ORAL_TABLET | Freq: Once | ORAL | Status: AC
  Administered 2014-02-08: 60 mg via ORAL
  Filled 2014-02-08 (×2): qty 1

## 2014-02-08 MED ORDER — FAMOTIDINE IN NACL 20-0.9 MG/50ML-% IV SOLN
20.0000 mg | Freq: Once | INTRAVENOUS | Status: DC
  Filled 2014-02-08: qty 50

## 2014-02-08 MED ORDER — FAMOTIDINE 20 MG PO TABS
20.0000 mg | ORAL_TABLET | Freq: Once | ORAL | Status: AC
  Administered 2014-02-08: 20 mg via ORAL
  Filled 2014-02-08: qty 1

## 2014-02-08 MED ORDER — METHYLPREDNISOLONE SODIUM SUCC 125 MG IJ SOLR
INTRAMUSCULAR | Status: AC
Start: 2014-02-08 — End: 2014-02-08
  Filled 2014-02-08: qty 2

## 2014-02-08 NOTE — ED Provider Notes (Signed)
CSN: [id number]     Arrival date & time 02/08/14  1557 History   First MD Initiated Contact with Patient 02/08/14 1628     Chief Complaint  Patient presents with  . Facial Swelling     (Consider location/radiation/quality/duration/timing/severity/associated sxs/prior Treatment) HPI Comments: Patient complains of swelling of her bilateral eyelids since 1:00 today after crying. She denies any difficulty breathing or swallowing. She denies any swelling of her face or mouth. She denies any medications. She taking Mucinex for her cough. She was seen one week ago for similar eyelid swelling that was attributed to bug bites. She denies any visual change. She denies any difficulty breathing, chest pain or shortness of breath. She denies having anything on her hands and denies rubbing her eyes.  The history is provided by the patient.    Past Medical History  Diagnosis Date  . Asthma   . Diabetes mellitus without complication   . Hypertension   . T.T.P. syndrome   . Seizures     last seizure Dec 2013  . Hand pain   . Stroke   . TTP (thrombotic thrombocytopenic purpura)    Past Surgical History  Procedure Laterality Date  . Tubal ligation    . Tonsillectomy     No family history on file. History  Substance Use Topics  . Smoking status: Never Smoker   . Smokeless tobacco: Never Used  . Alcohol Use: No   OB History   Grav Para Term Preterm Abortions TAB SAB Ect Mult Living                 Review of Systems  Constitutional: Negative for activity change and appetite change.  HENT: Negative for congestion and rhinorrhea.   Eyes: Positive for redness and itching. Negative for visual disturbance.  Respiratory: Negative for cough, chest tightness and shortness of breath.   Cardiovascular: Negative for chest pain.  Gastrointestinal: Negative for nausea, vomiting and abdominal pain.  Genitourinary: Negative for dysuria, hematuria, vaginal bleeding and vaginal discharge.   Musculoskeletal: Negative for back pain.  Skin: Negative for rash.  Neurological: Negative for dizziness, weakness and headaches.  A complete 10 system review of systems was obtained and all systems are negative except as noted in the HPI and PMH.      Allergies  Latex and Vicodin  Home Medications   Current Outpatient Rx  Name  Route  Sig  Dispense  Refill  . albuterol (PROVENTIL HFA;VENTOLIN HFA) 108 (90 BASE) MCG/ACT inhaler   Inhalation   Inhale 1-2 puffs into the lungs every 6 (six) hours as needed for wheezing.   1 Inhaler   3   . albuterol (PROVENTIL) (2.5 MG/3ML) 0.083% nebulizer solution   Nebulization   Take 2.5 mg by nebulization every 6 (six) hours as needed.         Marland Kitchen azithromycin (ZITHROMAX Z-PAK) 250 MG tablet      2 po day one, then 1 daily x 4 days   5 tablet   0   . benzonatate (TESSALON) 100 MG capsule   Oral   Take 2 capsules (200 mg total) by mouth 2 (two) times daily as needed for cough.   20 capsule   0   . diphenhydrAMINE (BENADRYL) 25 MG tablet   Oral   Take 1 tablet (25 mg total) by mouth every 6 (six) hours.   20 tablet   0   . diphenhydrAMINE (BENADRYL) 25 MG tablet   Oral   Take 1  tablet (25 mg total) by mouth every 6 (six) hours.   20 tablet   0   . EPINEPHrine (EPIPEN) 0.3 mg/0.3 mL SOAJ injection   Intramuscular   Inject 0.3 mLs (0.3 mg total) into the muscle as needed (for severe allergic reaction).   1 Device   1   . famotidine (PEPCID) 20 MG tablet   Oral   Take 1 tablet (20 mg total) by mouth 2 (two) times daily.   30 tablet   0   . famotidine (PEPCID) 20 MG tablet   Oral   Take 1 tablet (20 mg total) by mouth 2 (two) times daily.   30 tablet   0   . ferrous fumarate (HEMOCYTE - 106 MG FE) 325 (106 FE) MG TABS   Oral   Take 1 tablet by mouth.         . folic acid (FOLVITE) 1 MG tablet   Oral   Take 1 mg by mouth daily.         . Gabapentin (NEURONTIN PO)   Oral   Take by mouth.         .  GlipiZIDE (GLUCOTROL PO)   Oral   Take by mouth.         Marland Kitchen ibuprofen (ADVIL,MOTRIN) 400 MG tablet   Oral   Take 1 tablet (400 mg total) by mouth every 6 (six) hours as needed for pain.   20 tablet   0   . insulin detemir (LEVEMIR) 100 UNIT/ML injection   Subcutaneous   Inject into the skin at bedtime.         Marland Kitchen levothyroxine (SYNTHROID, LEVOTHROID) 100 MCG tablet   Oral   Take 100 mcg by mouth daily before breakfast.         . LISINOPRIL PO   Oral   Take by mouth.         . metoprolol succinate (TOPROL-XL) 25 MG 24 hr tablet   Oral   Take 25 mg by mouth daily.         . ondansetron (ZOFRAN) 8 MG tablet   Oral   Take by mouth every 8 (eight) hours as needed for nausea or vomiting.         Marland Kitchen oxyCODONE-acetaminophen (PERCOCET/ROXICET) 5-325 MG per tablet   Oral   Take 1 tablet by mouth every 4 (four) hours as needed for pain.   15 tablet   0   . PHENOBARBITAL PO   Oral   Take by mouth.         . potassium chloride SA (K-DUR,KLOR-CON) 20 MEQ tablet   Oral   Take 1 tablet (20 mEq total) by mouth daily. Take two tablets once.   2 tablet   0   . predniSONE (DELTASONE) 10 MG tablet      Starting tomorrow 02/03/3014 take 20 mg PO BID   12 tablet   0   . predniSONE (DELTASONE) 50 MG tablet      1 tablet PO daily   5 tablet   0   . Vitamin D, Ergocalciferol, (DRISDOL) 50000 UNITS CAPS   Oral   Take 50,000 Units by mouth.         . WARFARIN SODIUM PO   Oral   Take by mouth.          BP 166/98  Pulse 86  Temp(Src) 99.2 F (37.3 C) (Oral)  Resp 16  Ht 5\' 7"  (1.702 m)  Wt 241 lb (109.317  kg)  BMI 37.74 kg/m2  SpO2 98%  LMP 01/16/2014 Physical Exam  Constitutional: She is oriented to person, place, and time. She appears well-developed and well-nourished. No distress.  HENT:  Head: Normocephalic and atraumatic.  Right Ear: External ear normal.  Left Ear: External ear normal.  Mouth/Throat: Oropharynx is clear and moist. No  oropharyngeal exudate.  No asymmetry, uvula midline.  No tongue or lip swelling, no tongue elevation  Eyes: Conjunctivae and EOM are normal. Pupils are equal, round, and reactive to light. Right eye exhibits chemosis. Left eye exhibits chemosis.  Upper lid swelling bilaterally, no pain with EOM No fluorescein uptake on staining.  Neck: Normal range of motion. Neck supple.  Cardiovascular: Normal rate, regular rhythm, normal heart sounds and intact distal pulses.   No murmur heard. Pulmonary/Chest: Effort normal and breath sounds normal. No respiratory distress. She has no wheezes.  Abdominal: Soft. There is no tenderness. There is no rebound and no guarding.  Musculoskeletal: Normal range of motion. She exhibits no edema and no tenderness.  Neurological: She is alert and oriented to person, place, and time. No cranial nerve deficit. She exhibits normal muscle tone. Coordination normal.  Skin: Skin is warm.    ED Course  Procedures (including critical care time) Labs Review Labs Reviewed  CBG MONITORING, ED - Abnormal; Notable for the following:    Glucose-Capillary 168 (*)    All other components within normal limits  CBC WITH DIFFERENTIAL  COMPREHENSIVE METABOLIC PANEL  PROTIME-INR   Imaging Review No results found.  EKG Interpretation   None       MDM   Final diagnoses:  Allergic reaction    3 hours of upper eyelid swelling. No trauma. No difficulty breathing or swallowing. No new medications.  Patient given Benadryl, prednisone, Pepcid. Ice pack applied the eyelids.  Around 1800, patient complained of feeling like her throat was closing off and throat swelling. She was reassessed and her oropharynx appears about the same as on initial evaluation. Uvula is midline. There is no tongue elevation, lip swelling or tongue swelling.  She is given IM Solu-Medrol and epinephrine. Medication list reviewed. Patient states she is no longer on lisinopril and has not been on it  for several months. She is on metoprolol only for her BP.  Patient observed for 3 hours after epinephrine injection. She is feeling much better. There is no swelling of her tongue or lips. Her eyelid swelling has improved but still persists. Uncertain cause of her allergic reaction. She'll be discharged on prednisone, antihistamines and epinephrine pen. She is given a dose of her blood pressure medication.  Instructed to return with worsening symptoms including difficulty breathing, chest pain, shortness of breath, throat swelling or pain. Instructed to return to ED if she uses her epinephrine pen.  BP has improved in ED.    BP 166/98  Pulse 86  Temp(Src) 99.2 F (37.3 C) (Oral)  Resp 16  Ht 5\' 7"  (1.702 m)  Wt 241 lb (109.317 kg)  BMI 37.74 kg/m2  SpO2 98%  LMP 01/16/2014   CRITICAL CARE Performed by: Ezequiel Essex Total critical care time: 30 Critical care time was exclusive of separately billable procedures and treating other patients. Critical care was necessary to treat or prevent imminent or life-threatening deterioration. Critical care was time spent personally by me on the following activities: development of treatment plan with patient and/or surrogate as well as nursing, discussions with consultants, evaluation of patient's response to treatment, examination of patient, obtaining  history from patient or surrogate, ordering and performing treatments and interventions, ordering and review of laboratory studies, ordering and review of radiographic studies, pulse oximetry and re-evaluation of patient's condition.   Ezequiel Essex, MD 02/08/14 [phone]

## 2014-02-08 NOTE — Discharge Instructions (Signed)
Anaphylactic Reaction Use the steroids and antihistamines as prescribed. Follow up with your doctor. Return to the ED if you develop new or worsening symptoms. An anaphylactic reaction is a sudden, severe allergic reaction that involves the whole body. It can be life threatening. A hospital stay is often required. People with asthma, eczema, or hay fever are slightly more likely to have an anaphylactic reaction. CAUSES  An anaphylactic reaction may be caused by anything to which you are allergic. After being exposed to the allergic substance, your immune system becomes sensitized to it. When you are exposed to that allergic substance again, an allergic reaction can occur. Common causes of an anaphylactic reaction include:  Medicines.  Foods, especially peanuts, wheat, shellfish, milk, and eggs.  Insect bites or stings.  Blood products.  Chemicals, such as dyes, latex, and contrast material used for imaging tests. SYMPTOMS  When an allergic reaction occurs, the body releases histamine and other substances. These substances cause symptoms such as tightening of the airway. Symptoms often develop within seconds or minutes of exposure. Symptoms may include:  Skin rash or hives.  Itching.  Chest tightness.  Swelling of the eyes, tongue, or lips.  Trouble breathing or swallowing.  Lightheadedness or fainting.  Anxiety or confusion.  Stomach pains, vomiting, or diarrhea.  Nasal congestion.  A fast or irregular heartbeat (palpitations). DIAGNOSIS  Diagnosis is based on your history of recent exposure to allergic substances, your symptoms, and a physical exam. Your caregiver may also perform blood or urine tests to confirm the diagnosis. TREATMENT  Epinephrine medicine is the main treatment for an anaphylactic reaction. Other medicines that may be used for treatment include antihistamines, steroids, and albuterol. In severe cases, fluids and medicine to support blood pressure may be  given through an intravenous line (IV). Even if you improve after treatment, you need to be observed to make sure your condition does not get worse. This may require a stay in the hospital. [place] a medical alert bracelet or necklace stating your allergy.  You and your family must learn how to use an anaphylaxis kit or give an epinephrine injection to temporarily treat an emergency allergic reaction. Always carry your epinephrine injection or anaphylaxis kit with you. This can be lifesaving if you have a severe reaction.  Do not drive or perform tasks after treatment until the medicines used to treat your reaction have worn off, or until your caregiver says it is okay.  If you have hives or a rash:  Take medicines as directed by your caregiver.  You may use an over-the-counter antihistamine (diphenhydramine) as needed.  Apply cold compresses to the skin or take baths in cool water. Avoid hot baths or showers. SEEK MEDICAL CARE IF:   You develop symptoms of an allergic reaction to a new substance. Symptoms may start right away or minutes later.  You develop a rash, hives, or itching.  You develop new symptoms. SEEK IMMEDIATE MEDICAL CARE IF:   You have swelling of the mouth, difficulty breathing, or wheezing.  You have a tight feeling in your chest or throat.  You develop hives, swelling, or itching all over your body.  You develop severe vomiting or diarrhea.  You feel faint or pass out. This is an emergency. Use your epinephrine injection or anaphylaxis kit as you have been instructed. Call your local emergency services (911 in [place] Even if you improve after the injection, you need to be examined at a hospital emergency department.  MAKE SURE YOU:   Understand these instructions.  Will watch your condition.  Will get help right away if you are not doing well or get worse. Document Released: 12/06/2005 Document Revised: 06/06/2012 Document Reviewed:  03/08/2012 [facility] Patient Information 2014 [place], [place].

## 2014-02-08 NOTE — ED Notes (Signed)
Attempting IV access.  Pt placed on monitor and states she feels like she's breathing better.

## 2014-02-08 NOTE — ED Notes (Signed)
Unable to obtain labs at this time, EDP aware, states ok to hold off on obtaining labs at this time.

## 2014-02-08 NOTE — ED Notes (Signed)
Attempted IV x2 attempts. First attempt in Left Hand. Second attempt in RAC.  Both unsuccessful.

## 2014-02-08 NOTE — ED Notes (Signed)
MD at bedside. 

## 2014-02-08 NOTE — ED Notes (Signed)
Pt tolerating PO fluids without difficulty. No N/V/D - No distress, No dysphagia noted.

## 2014-02-08 NOTE — ED Notes (Signed)
MD at bedside to reassess pt after epi and solumedrol given. Aware that unable to obtain IV access after RN/RT attempt x 3

## 2014-02-08 NOTE — ED Notes (Signed)
Swelling of eyelids since 1300 today.  "After I cried, that's when it started swelling."  No other swelling of face or mouth.  No new meds.  Sts she has taken Mucinex x3 days and has never taken it before.

## 2014-02-08 NOTE — ED Notes (Signed)
CBG 168. 

## 2014-07-21 ENCOUNTER — Emergency Department (HOSPITAL_BASED_OUTPATIENT_CLINIC_OR_DEPARTMENT_OTHER): Payer: Medicaid - Out of State

## 2014-07-21 ENCOUNTER — Emergency Department (HOSPITAL_BASED_OUTPATIENT_CLINIC_OR_DEPARTMENT_OTHER)
Admission: EM | Admit: 2014-07-21 | Discharge: 2014-07-21 | Disposition: A | Discharge status: Home / Self Care | Payer: Medicaid - Out of State | Attending: Emergency Medicine | Admitting: Emergency Medicine

## 2014-07-21 ENCOUNTER — Encounter (HOSPITAL_BASED_OUTPATIENT_CLINIC_OR_DEPARTMENT_OTHER): Payer: Self-pay | Admitting: Emergency Medicine

## 2014-07-21 DIAGNOSIS — Z91040 Latex allergy status: Secondary | ICD-10-CM | POA: Insufficient documentation

## 2014-07-21 DIAGNOSIS — R609 Edema, unspecified: Secondary | ICD-10-CM | POA: Diagnosis not present

## 2014-07-21 DIAGNOSIS — Z794 Long term (current) use of insulin: Secondary | ICD-10-CM | POA: Diagnosis not present

## 2014-07-21 DIAGNOSIS — Z87448 Personal history of other diseases of urinary system: Secondary | ICD-10-CM | POA: Insufficient documentation

## 2014-07-21 DIAGNOSIS — I10 Essential (primary) hypertension: Secondary | ICD-10-CM | POA: Insufficient documentation

## 2014-07-21 DIAGNOSIS — J45909 Unspecified asthma, uncomplicated: Secondary | ICD-10-CM | POA: Insufficient documentation

## 2014-07-21 DIAGNOSIS — R079 Chest pain, unspecified: Secondary | ICD-10-CM | POA: Diagnosis present

## 2014-07-21 DIAGNOSIS — Z8673 Personal history of transient ischemic attack (TIA), and cerebral infarction without residual deficits: Secondary | ICD-10-CM | POA: Insufficient documentation

## 2014-07-21 DIAGNOSIS — E669 Obesity, unspecified: Secondary | ICD-10-CM | POA: Insufficient documentation

## 2014-07-21 DIAGNOSIS — G40909 Epilepsy, unspecified, not intractable, without status epilepticus: Secondary | ICD-10-CM | POA: Diagnosis not present

## 2014-07-21 DIAGNOSIS — E119 Type 2 diabetes mellitus without complications: Secondary | ICD-10-CM | POA: Insufficient documentation

## 2014-07-21 DIAGNOSIS — E876 Hypokalemia: Secondary | ICD-10-CM | POA: Diagnosis not present

## 2014-07-21 DIAGNOSIS — Z79899 Other long term (current) drug therapy: Secondary | ICD-10-CM | POA: Insufficient documentation

## 2014-07-21 DIAGNOSIS — R0789 Other chest pain: Secondary | ICD-10-CM

## 2014-07-21 HISTORY — DX: Disorder of kidney and ureter, unspecified: N28.9

## 2014-07-21 LAB — COMPREHENSIVE METABOLIC PANEL
ALT: 10 U/L (ref 0–35)
ANION GAP: 14 (ref 5–15)
AST: 14 U/L (ref 0–37)
Albumin: 1.2 g/dL — ABNORMAL LOW (ref 3.5–5.2)
Alkaline Phosphatase: 98 U/L (ref 39–117)
BUN: 18 mg/dL (ref 6–23)
CALCIUM: 7.9 mg/dL — AB (ref 8.4–10.5)
CO2: 20 mEq/L (ref 19–32)
CREATININE: 2.5 mg/dL — AB (ref 0.50–1.10)
Chloride: 107 mEq/L (ref 96–112)
GFR calc non Af Amer: 23 mL/min — ABNORMAL LOW (ref >90)
GFR, EST AFRICAN AMERICAN: 26 mL/min — AB (ref >90)
GLUCOSE: 185 mg/dL — AB (ref 70–99)
Potassium: 2.9 mEq/L — CL (ref 3.7–5.3)
SODIUM: 141 meq/L (ref 137–147)
TOTAL PROTEIN: 4.9 g/dL — AB (ref 6.0–8.3)
Total Bilirubin: <0.2 mg/dL — ABNORMAL LOW (ref 0.3–1.2)

## 2014-07-21 LAB — CBC WITH DIFFERENTIAL/PLATELET
Basophils Absolute: 0 10*3/uL (ref 0.0–0.1)
Basophils Relative: 0 % (ref 0–1)
EOS ABS: 0.2 10*3/uL (ref 0.0–0.7)
EOS PCT: 1 % (ref 0–5)
HEMATOCRIT: 26.1 % — AB (ref 36.0–46.0)
Hemoglobin: 8.5 g/dL — ABNORMAL LOW (ref 12.0–15.0)
LYMPHS ABS: 3.2 10*3/uL (ref 0.7–4.0)
Lymphocytes Relative: 21 % (ref 12–46)
MCH: 23.6 pg — AB (ref 26.0–34.0)
MCHC: 32.6 g/dL (ref 30.0–36.0)
MCV: 72.5 fL — AB (ref 78.0–100.0)
MONO ABS: 1.5 10*3/uL — AB (ref 0.1–1.0)
Monocytes Relative: 10 % (ref 3–12)
Neutro Abs: 10.8 10*3/uL — ABNORMAL HIGH (ref 1.7–7.7)
Neutrophils Relative %: 69 % (ref 43–77)
PLATELETS: 360 10*3/uL (ref 150–400)
RBC: 3.6 MIL/uL — AB (ref 3.87–5.11)
RDW: 15.7 % — ABNORMAL HIGH (ref 11.5–15.5)
WBC: 15.8 10*3/uL — ABNORMAL HIGH (ref 4.0–10.5)

## 2014-07-21 LAB — PROTIME-INR
INR: 0.96 (ref 0.00–1.49)
PROTHROMBIN TIME: 12.8 s (ref 11.6–15.2)

## 2014-07-21 LAB — TROPONIN I

## 2014-07-21 MED ORDER — ASPIRIN 325 MG PO TABS
325.0000 mg | ORAL_TABLET | ORAL | Status: AC
  Administered 2014-07-21: 325 mg via ORAL
  Filled 2014-07-21: qty 1

## 2014-07-21 MED ORDER — POTASSIUM CHLORIDE ER 10 MEQ PO TBCR: 20.0000 meq | EXTENDED_RELEASE_TABLET | Freq: Two times a day (BID) | ORAL | Status: AC

## 2014-07-21 MED ORDER — POTASSIUM CHLORIDE CRYS ER 20 MEQ PO TBCR
40.0000 meq | EXTENDED_RELEASE_TABLET | Freq: Once | ORAL | Status: AC
  Administered 2014-07-21: 40 meq via ORAL
  Filled 2014-07-21: qty 2

## 2014-07-21 NOTE — Discharge Instructions (Signed)

## 2014-07-21 NOTE — ED Notes (Signed)
5 attempts for IV access using ultrasound unsuccessful, blood draw with butterfly by MD using ultrasound

## 2014-07-21 NOTE — ED Provider Notes (Signed)
CSN: [id number]     Arrival date & time 07/21/14  [id number] History   First MD Initiated Contact with Patient 07/21/14 [phone]     Chief Complaint  Patient presents with  . Chest Pain     (Consider location/radiation/quality/duration/timing/severity/associated sxs/prior Treatment) Patient is a 43 y.o. female presenting with chest pain. The history is provided by the patient.  Chest Pain Pain location:  L chest Pain quality: pressure   Pain radiates to:  Does not radiate Pain radiates to the back: no   Pain severity:  Mild Onset quality:  Sudden Duration:  1 day Timing:  Intermittent Progression:  Unchanged Chronicity:  New Context: at rest   Relieved by:  Nothing Worsened by:  Nothing tried Ineffective treatments:  Antacids Associated symptoms: diaphoresis   Associated symptoms: no abdominal pain, no back pain, no cough, no dizziness, no fatigue, no fever, no headache, no nausea, no shortness of breath and not vomiting     Past Medical History  Diagnosis Date  . Asthma   . Diabetes mellitus without complication   . Hypertension   . T.T.P. syndrome   . Seizures     last seizure Dec 2013  . Hand pain   . Stroke   . TTP (thrombotic thrombocytopenic purpura)   . Renal disorder    Past Surgical History  Procedure Laterality Date  . Tubal ligation    . Tonsillectomy     No family history on file. History  Substance Use Topics  . Smoking status: Never Smoker   . Smokeless tobacco: Never Used  . Alcohol Use: No   OB History   Grav Para Term Preterm Abortions TAB SAB Ect Mult Living                 Review of Systems  Constitutional: Positive for diaphoresis. Negative for fever and fatigue.  HENT: Negative for congestion and drooling.   Eyes: Negative for pain.  Respiratory: Negative for cough and shortness of breath.   Cardiovascular: Positive for chest pain.  Gastrointestinal: Negative for nausea, vomiting, abdominal pain and diarrhea.  Genitourinary: Negative for  dysuria and hematuria.  Musculoskeletal: Negative for back pain, gait problem and neck pain.  Skin: Negative for color change.  Neurological: Negative for dizziness and headaches.  Hematological: Negative for adenopathy.  Psychiatric/Behavioral: Negative for behavioral problems.  All other systems reviewed and are negative.     Allergies  Latex and Vicodin  Home Medications   Prior to Admission medications   Medication Sig Start Date End Date Taking? Authorizing Provider  albuterol (PROVENTIL HFA;VENTOLIN HFA) 108 (90 BASE) MCG/ACT inhaler Inhale 1-2 puffs into the lungs every 6 (six) hours as needed for wheezing. 10/30/12   Tia Oliveri, PA-C  albuterol (PROVENTIL) (2.5 MG/3ML) 0.083% nebulizer solution Take 2.5 mg by nebulization every 6 (six) hours as needed.    Historical Provider, MD  diphenhydrAMINE (BENADRYL) 25 MG tablet Take 1 tablet (25 mg total) by mouth every 6 (six) hours. 02/08/14   Ezequiel Essex, MD  EPINEPHrine (EPIPEN) 0.3 mg/0.3 mL SOAJ injection Inject 0.3 mLs (0.3 mg total) into the muscle as needed (for severe allergic reaction). 02/08/14   Ezequiel Essex, MD  famotidine (PEPCID) 20 MG tablet Take 1 tablet (20 mg total) by mouth 2 (two) times daily. 02/02/14   [place], NP  famotidine (PEPCID) 20 MG tablet Take 1 tablet (20 mg total) by mouth 2 (two) times daily. 02/08/14   Ezequiel Essex, MD  folic acid (FOLVITE) 1  MG tablet Take 1 mg by mouth daily.    Historical Provider, MD  Gabapentin (NEURONTIN PO) Take by mouth.    Historical Provider, MD  ibuprofen (ADVIL,MOTRIN) 400 MG tablet Take 1 tablet (400 mg total) by mouth every 6 (six) hours as needed for pain. 02/12/13   Varney Biles, MD  insulin detemir (LEVEMIR) 100 UNIT/ML injection Inject into the skin at bedtime.    Historical Provider, MD  levothyroxine (SYNTHROID, LEVOTHROID) 100 MCG tablet Take 100 mcg by mouth daily before breakfast.    Historical Provider, MD  LISINOPRIL PO Take by mouth.    Historical  Provider, MD  metoprolol succinate (TOPROL-XL) 25 MG 24 hr tablet Take 25 mg by mouth daily.    Historical Provider, MD  ondansetron (ZOFRAN) 8 MG tablet Take by mouth every 8 (eight) hours as needed for nausea or vomiting.    Historical Provider, MD  oxyCODONE-acetaminophen (PERCOCET/ROXICET) 5-325 MG per tablet Take 1 tablet by mouth every 4 (four) hours as needed for pain. 02/12/13   Varney Biles, MD  PHENOBARBITAL PO Take by mouth.    Historical Provider, MD  potassium chloride SA (K-DUR,KLOR-CON) 20 MEQ tablet Take 1 tablet (20 mEq total) by mouth daily. Take two tablets once. 10/30/12   Annalee Genta, PA-C  Vitamin D, Ergocalciferol, (DRISDOL) 50000 UNITS CAPS Take 50,000 Units by mouth.    Historical Provider, MD  WARFARIN SODIUM PO Take by mouth.    Historical Provider, MD   There were no vitals taken for this visit. Physical Exam  Nursing note and vitals reviewed. Constitutional: She is oriented to person, place, and time. She appears well-developed and well-nourished.  obese  HENT:  Head: Normocephalic.  Mouth/Throat: Oropharynx is clear and moist. No oropharyngeal exudate.  Eyes: Conjunctivae and EOM are normal. Pupils are equal, round, and reactive to light.  Neck: Normal range of motion. Neck supple.  Cardiovascular: Normal rate, regular rhythm, normal heart sounds and intact distal pulses.  Exam reveals no gallop and no friction rub.   No murmur heard. Pulmonary/Chest: Effort normal and breath sounds normal. No respiratory distress. She has no wheezes.  Abdominal: Soft. Bowel sounds are normal. There is no tenderness. There is no rebound and no guarding.  Musculoskeletal: Normal range of motion. She exhibits edema (mild to mod pitting edema in bilateral LE's extending to lower thighs). She exhibits no tenderness.  Neurological: She is alert and oriented to person, place, and time.  Skin: Skin is warm and dry.  1x1 cm ulcer to the lateral aspect of the 5th digit, left foot.  (pictured)  Psychiatric: She has a normal mood and affect. Her behavior is normal.      ED Course  Procedures (including critical care time) Labs Review Labs Reviewed  CBC WITH DIFFERENTIAL - Abnormal; Notable for the following:    WBC 15.8 (*)    RBC 3.60 (*)    Hemoglobin 8.5 (*)    HCT 26.1 (*)    MCV 72.5 (*)    MCH 23.6 (*)    RDW 15.7 (*)    Neutro Abs 10.8 (*)    Monocytes Absolute 1.5 (*)    All other components within normal limits  COMPREHENSIVE METABOLIC PANEL - Abnormal; Notable for the following:    Potassium 2.9 (*)    Glucose, Bld 185 (*)    Creatinine, Ser 2.50 (*)    Calcium 7.9 (*)    Total Protein 4.9 (*)    Albumin 1.2 (*)    Total  Bilirubin <0.2 (*)    GFR calc non Af Amer 23 (*)    GFR calc Af Amer 26 (*)    All other components within normal limits  TROPONIN I  PROTIME-INR    Imaging Review Dg Chest 2 View  07/21/2014   CLINICAL DATA:  Left-sided chest pain  EXAM: CHEST  2 VIEW  COMPARISON:  Prior chest x-ray 10/30/2012  FINDINGS: The lungs are clear and negative for focal airspace consolidation, pulmonary edema or suspicious pulmonary nodule. Stable central bronchitic changes. No pleural effusion or pneumothorax. Cardiac and mediastinal contours are within normal limits. No acute fracture or lytic or blastic osseous lesions. The visualized upper abdominal bowel gas pattern is unremarkable.  IMPRESSION: No active cardiopulmonary disease.   Electronically Signed   By: Jacqulynn Cadet M.D.   On: 07/21/2014 10:52   Dg Foot Complete Left  07/21/2014   CLINICAL DATA:  Soft tissue wound adjacent to the fifth toe of the left foot  EXAM: LEFT FOOT - COMPLETE 3+ VIEW  COMPARISON:  None.  FINDINGS: There is no evidence of fracture or dislocation. There is no evidence of arthropathy or other focal bone abnormality. Soft tissues demonstrate minimal prominence over the dorsum of the foot. Vascular calcifications are noted.  IMPRESSION: Mild prominence of the dorsal  soft tissues without acute osseous abnormality.   Electronically Signed   By: Conchita Paris M.D.   On: 07/21/2014 10:52     EKG Interpretation   Date/Time:  Sunday July 21 2014 09:29:50 EDT Ventricular Rate:  83 PR Interval:  174 QRS Duration: 96 QT Interval:  404 QTC Calculation: 474 R Axis:   74 Text Interpretation:  Normal sinus rhythm Nonspecific T wave abnormality  Prolonged QT Confirmed by Myiah Petkus  MD, Attie Nawabi ([id number]) on 07/21/2014 9:39:35  AM     Procedure note: Ultrasound Guided Peripheral IV Ultrasound guided 20 g peripheral 1.88 inch angiocath IV placement performed by me. Indications: Nursing unable to place IV. Details: The left antecubital fossa and upper arm was evaluated with a multifrequency linear probe. Several patent brachial veins are noted. 5 attempts were made to cannulate a left basilic vein under realtime US guidance with successful cannulation of the vein and catheter placement. There is return of non-pulsatile dark red blood. The patient tolerated the procedure well without complications.    MDM   Final diagnoses:  Other chest pain  Hypokalemia    9:48 AM 43 y.o. female w hx of asthma, DM, HTN, CVA, seizures on coumadin who presents with chest pressure. She states that her symptoms began yesterday while at rest when she was outside at a family reunion. She states that her pain would last minutes at a time and was intermittent throughout the day. She had another episode this morning at around 7:30 AM lasted several minutes but she has not had any chest pain since that time and denies any chest pain or shortness of breath on exam currently. She did state that she had some diaphoresis yesterday but was also outside and heat. She is afebrile and vital signs are unremarkable here. She is currently asymptomatic. She also notes that she bumped her foot in the bathroom several weeks ago and has a healing ulcer to the fifth digit of the left foot. Will get screening  labs and imaging.  Her care was delayed by difficulty obtaining IV access.   2:53 PM: The pt has remained asx in the ED. Continues to deny cp/sob. While she has multiple lab  abn's it is likely that these are not new findings as she has known CKD and follows w/ a nephrologist in [place] where she lives. She is in town for a family reunion. 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 She is leaving for [place] today and can f/u w/ her nephrologist tomorrow. She is low risk for MACE per HEART score. Would recommend continued routine wound care for the ulcer on her foot which can be followed by her pcp. I have discussed the diagnosis/risks/treatment options with the patient and believe the pt to be eligible for discharge home to follow-up with her nephrologist tomorrow. We also discussed returning to the ED immediately if new or worsening sx occur. We discussed the sx which are most concerning (e.g., return of pain, sob, fever) that necessitate immediate return. Medications administered to the patient during their visit and any new prescriptions provided to the patient are listed below.  Medications given during this visit Medications  aspirin tablet 325 mg (325 mg Oral Given 07/21/14 1003)  potassium chloride SA (K-DUR,KLOR-CON) CR tablet 40 mEq (40 mEq Oral Given 07/21/14 1351)    Discharge Medication List as of 07/21/2014  3:03 PM    START taking these medications   Details  potassium chloride (K-DUR) 10 MEQ tablet Take 2 tablets (20 mEq total) by mouth 2 (two) times daily., Starting 07/21/2014, Until Discontinued, Print         Blanchard Kelch, MD 07/22/14 [phone]

## 2014-07-21 NOTE — ED Notes (Signed)
Patient here with chest pressure that started yesterday. Reports that she has been doing a lot of cooking and caring for a lot of family the past several days and thinks this is contributing to her discomfort. Reports that she has been treating with antacids and no relief. Became diaphoretic and little rest last pm due to the discomfort. No shortness of breath, no radiation. No nausea. Also request that left foot-little toe be checked after hitting same several weeks ago, dark in color, complains of mild discomfort to same. Minimal chest pain on assessment, describes as cramping feeling.

## 2015-02-18 ENCOUNTER — Other Ambulatory Visit: Payer: Self-pay

## 2015-02-18 ENCOUNTER — Emergency Department (HOSPITAL_BASED_OUTPATIENT_CLINIC_OR_DEPARTMENT_OTHER)
Admission: EM | Admit: 2015-02-18 | Discharge: 2015-02-18 | Disposition: A | Discharge status: Home / Self Care | Payer: Medicare Other | Attending: Emergency Medicine | Admitting: Emergency Medicine

## 2015-02-18 ENCOUNTER — Encounter (HOSPITAL_BASED_OUTPATIENT_CLINIC_OR_DEPARTMENT_OTHER): Payer: Self-pay | Admitting: *Deleted

## 2015-02-18 DIAGNOSIS — G40909 Epilepsy, unspecified, not intractable, without status epilepticus: Secondary | ICD-10-CM | POA: Insufficient documentation

## 2015-02-18 DIAGNOSIS — I10 Essential (primary) hypertension: Secondary | ICD-10-CM | POA: Insufficient documentation

## 2015-02-18 DIAGNOSIS — R569 Unspecified convulsions: Secondary | ICD-10-CM

## 2015-02-18 DIAGNOSIS — E119 Type 2 diabetes mellitus without complications: Secondary | ICD-10-CM | POA: Diagnosis not present

## 2015-02-18 DIAGNOSIS — Z794 Long term (current) use of insulin: Secondary | ICD-10-CM | POA: Insufficient documentation

## 2015-02-18 DIAGNOSIS — Z8673 Personal history of transient ischemic attack (TIA), and cerebral infarction without residual deficits: Secondary | ICD-10-CM | POA: Diagnosis not present

## 2015-02-18 DIAGNOSIS — J45909 Unspecified asthma, uncomplicated: Secondary | ICD-10-CM | POA: Insufficient documentation

## 2015-02-18 DIAGNOSIS — Z91040 Latex allergy status: Secondary | ICD-10-CM | POA: Insufficient documentation

## 2015-02-18 DIAGNOSIS — R42 Dizziness and giddiness: Secondary | ICD-10-CM | POA: Diagnosis present

## 2015-02-18 DIAGNOSIS — Z79899 Other long term (current) drug therapy: Secondary | ICD-10-CM | POA: Insufficient documentation

## 2015-02-18 DIAGNOSIS — N289 Disorder of kidney and ureter, unspecified: Secondary | ICD-10-CM | POA: Insufficient documentation

## 2015-02-18 DIAGNOSIS — M25562 Pain in left knee: Secondary | ICD-10-CM | POA: Diagnosis not present

## 2015-02-18 DIAGNOSIS — M7989 Other specified soft tissue disorders: Secondary | ICD-10-CM | POA: Diagnosis not present

## 2015-02-18 DIAGNOSIS — R319 Hematuria, unspecified: Secondary | ICD-10-CM

## 2015-02-18 LAB — COMPREHENSIVE METABOLIC PANEL
ALBUMIN: 1.8 g/dL — AB (ref 3.5–5.2)
ALK PHOS: 74 U/L (ref 39–117)
ALT: 12 U/L (ref 0–35)
AST: 17 U/L (ref 0–37)
Anion gap: 3 — ABNORMAL LOW (ref 5–15)
BUN: 29 mg/dL — ABNORMAL HIGH (ref 6–23)
CO2: 18 mmol/L — ABNORMAL LOW (ref 19–32)
Calcium: 7.3 mg/dL — ABNORMAL LOW (ref 8.4–10.5)
Chloride: 119 mmol/L — ABNORMAL HIGH (ref 96–112)
Creatinine, Ser: 2.61 mg/dL — ABNORMAL HIGH (ref 0.50–1.10)
GFR calc non Af Amer: 21 mL/min — ABNORMAL LOW (ref >90)
GFR, EST AFRICAN AMERICAN: 25 mL/min — AB (ref >90)
GLUCOSE: 172 mg/dL — AB (ref 70–99)
POTASSIUM: 3.2 mmol/L — AB (ref 3.5–5.1)
SODIUM: 140 mmol/L (ref 135–145)
TOTAL PROTEIN: 4.8 g/dL — AB (ref 6.0–8.3)
Total Bilirubin: 0.1 mg/dL — ABNORMAL LOW (ref 0.3–1.2)

## 2015-02-18 LAB — URINALYSIS, ROUTINE W REFLEX MICROSCOPIC
BILIRUBIN URINE: NEGATIVE
Glucose, UA: >1000 mg/dL — AB
KETONES UR: NEGATIVE mg/dL
Leukocytes, UA: NEGATIVE
NITRITE: NEGATIVE
PH: 7 (ref 5.0–8.0)
Specific Gravity, Urine: 1.026 (ref 1.005–1.030)
UROBILINOGEN UA: 0.2 mg/dL (ref 0.0–1.0)

## 2015-02-18 LAB — CBC WITH DIFFERENTIAL/PLATELET
BASOS ABS: 0 10*3/uL (ref 0.0–0.1)
BASOS PCT: 0 % (ref 0–1)
EOS ABS: 0.3 10*3/uL (ref 0.0–0.7)
EOS PCT: 2 % (ref 0–5)
HCT: 36.8 % (ref 36.0–46.0)
HEMOGLOBIN: 11.9 g/dL — AB (ref 12.0–15.0)
Lymphocytes Relative: 27 % (ref 12–46)
Lymphs Abs: 4 10*3/uL (ref 0.7–4.0)
MCH: 24.7 pg — AB (ref 26.0–34.0)
MCHC: 32.3 g/dL (ref 30.0–36.0)
MCV: 76.3 fL — ABNORMAL LOW (ref 78.0–100.0)
Monocytes Absolute: 1.5 10*3/uL — ABNORMAL HIGH (ref 0.1–1.0)
Monocytes Relative: 10 % (ref 3–12)
NEUTROS PCT: 61 % (ref 43–77)
Neutro Abs: 8.9 10*3/uL — ABNORMAL HIGH (ref 1.7–7.7)
PLATELETS: 240 10*3/uL (ref 150–400)
RBC: 4.82 MIL/uL (ref 3.87–5.11)
RDW: 16.3 % — AB (ref 11.5–15.5)
WBC: 14.7 10*3/uL — AB (ref 4.0–10.5)

## 2015-02-18 LAB — URINE MICROSCOPIC-ADD ON

## 2015-02-18 LAB — CBG MONITORING, ED: Glucose-Capillary: 157 mg/dL — ABNORMAL HIGH (ref 70–99)

## 2015-02-18 MED ORDER — LEVETIRACETAM 500 MG PO TABS
500.0000 mg | ORAL_TABLET | Freq: Once | ORAL | Status: AC
  Administered 2015-02-18: 500 mg via ORAL
  Filled 2015-02-18: qty 1

## 2015-02-18 MED ORDER — SODIUM CHLORIDE 0.9 % IV BOLUS (SEPSIS)
500.0000 mL | Freq: Once | INTRAVENOUS | Status: AC
  Administered 2015-02-18: 500 mL via INTRAVENOUS

## 2015-02-18 MED ORDER — FUROSEMIDE 10 MG/ML IJ SOLN
80.0000 mg | Freq: Once | INTRAMUSCULAR | Status: AC
  Administered 2015-02-18: 80 mg via INTRAVENOUS
  Filled 2015-02-18: qty 8

## 2015-02-18 MED ORDER — CLONIDINE HCL 0.1 MG PO TABS
0.1000 mg | ORAL_TABLET | Freq: Once | ORAL | Status: AC
  Administered 2015-02-18: 0.1 mg via ORAL
  Filled 2015-02-18: qty 1

## 2015-02-18 NOTE — ED Notes (Signed)
Pt became dizzy and fell apprx. 1 hour pta and now has bruise on her right arm. Pt has TTP.

## 2015-02-18 NOTE — Discharge Instructions (Signed)
Epilepsy °Epilepsy is a disorder in which a person has repeated seizures over time. A seizure is a release of abnormal electrical activity in the brain. Seizures can cause a change in attention, behavior, or the ability to remain awake and alert (altered mental status). Seizures often involve uncontrollable shaking (convulsions).  °Most people with epilepsy lead normal lives. However, people with epilepsy are at an increased risk of falls, accidents, and injuries. Therefore, it is important to begin treatment right away. °CAUSES  °Epilepsy has many possible causes. Anything that disturbs the normal pattern of brain cell activity can lead to seizures. This may include:  °· Head injury. °· Birth trauma. °· High fever as a child. °· Stroke. °· Bleeding into or around the brain. °· Certain drugs. °· Prolonged low oxygen, such as what occurs after CPR efforts. °· Abnormal brain development. °· Certain illnesses, such as meningitis, encephalitis (brain infection), malaria, and other infections. °· An imbalance of nerve signaling chemicals (neurotransmitters).   °SIGNS AND SYMPTOMS  °The symptoms of a seizure can vary greatly from one person to another. Right before a seizure, you may have a warning (aura) that a seizure is about to occur. An aura may include the following symptoms: °· Fear or anxiety. °· Nausea. °· Feeling like the room is spinning (vertigo). °· Vision changes, such as seeing flashing lights or spots. °Common symptoms during a seizure include: °· Abnormal sensations, such as an abnormal smell or a bitter taste in the mouth.   °· Sudden, general body stiffness.   °· Convulsions that involve rhythmic jerking of the face, arm, or leg on one or both sides.   °· Sudden change in consciousness.   °· Appearing to be awake but not responding.   °· Appearing to be asleep but cannot be awakened.   °· Grimacing, chewing, lip smacking, drooling, tongue biting, or loss of bowel or bladder control. °After a seizure,  you may feel sleepy for a while.  °DIAGNOSIS  °Your health care provider will ask about your symptoms and take a medical history. Descriptions from any witnesses to your seizures will be very helpful in the diagnosis. A physical exam, including a detailed neurological exam, is necessary. Various tests may be done, such as:  °· An electroencephalogram (EEG). This is a painless test of your brain waves. In this test, a diagram is created of your brain waves. These diagrams can be interpreted by a specialist. °· An MRI of the brain.   °· A CT scan of the brain.   °· A spinal tap (lumbar puncture, LP). °· Blood tests to check for signs of infection or abnormal blood chemistry. °TREATMENT  °There is no cure for epilepsy, but it is generally treatable. Once epilepsy is diagnosed, it is important to begin treatment as soon as possible. For most people with epilepsy, seizures can be controlled with medicines. The following may also be used: °· A pacemaker for the brain (vagus nerve stimulator) can be used for people with seizures that are not well controlled by medicine. °· Surgery on the brain. °For some people, epilepsy eventually goes away. °HOME CARE INSTRUCTIONS  °· Follow your health care provider's recommendations on driving and safety in normal activities. °· Get enough rest. Lack of sleep can cause seizures. °· Only take over-the-counter or prescription medicines as directed by your health care provider. Take any prescribed medicine exactly as directed. °· Avoid any known triggers of your seizures. °· Keep a seizure diary. Record what you recall about any seizure, especially any possible trigger.   °· Make   sure the people you live and work with know that you are prone to seizures. They should receive instructions on how to help you. In general, a witness to a seizure should:   Cushion your head and body.   Turn you on your side.   Avoid unnecessarily restraining you.   Not place anything inside your  mouth.   Call for emergency medical help if there is any question about what has occurred.   Follow up with your health care provider as directed. You may need regular blood tests to monitor the levels of your medicine.  SEEK MEDICAL CARE IF:   You develop signs of infection or other illness. This might increase the risk of a seizure.   You seem to be having more frequent seizures.   Your seizure pattern is changing.  SEEK IMMEDIATE MEDICAL CARE IF:   You have a seizure that does not stop after a few moments.   You have a seizure that causes any difficulty in breathing.   You have a seizure that results in a very severe headache.   You have a seizure that leaves you with the inability to speak or use a part of your body.  Document Released: 12/06/2005 Document Revised: 09/26/2013 Document Reviewed: 07/18/2013 [facility] Patient Information 2015 [place], [place]. This information is not intended to replace advice given to you by your health care provider. Make sure you discuss any questions you have with your health care provider.  Hematuria Hematuria is blood in your urine. It can be caused by a bladder infection, kidney infection, prostate infection, kidney stone, or cancer of your urinary tract. Infections can usually be treated with medicine, and a kidney stone usually will pass through your urine. If neither of these is the cause of your hematuria, further workup to find out the reason may be needed. It is very important that you tell your health care provider about any blood you see in your urine, even if the blood stops without treatment or happens without causing pain. Blood in your urine that happens and then stops and then happens again can be a symptom of a very serious condition. Also, pain is not a symptom in the initial stages of many urinary cancers. HOME CARE INSTRUCTIONS   Drink lots of fluid, 3-4 quarts a day. If you have been diagnosed with an infection,  cranberry juice is especially recommended, in addition to large amounts of water.  Avoid caffeine, tea, and carbonated beverages because they tend to irritate the bladder.  Avoid alcohol because it may irritate the prostate.  Take all medicines as directed by your health care provider.  If you were prescribed an antibiotic medicine, finish it all even if you start to feel better.  If you have been diagnosed with a kidney stone, follow your health care provider's instructions regarding straining your urine to catch the stone.  Empty your bladder often. Avoid holding urine for long periods of time.  After a bowel movement, women should cleanse front to back. Use each tissue only once.  Empty your bladder before and after sexual intercourse if you are a female. SEEK MEDICAL CARE IF:  You develop back pain.  You have a fever.  You have a feeling of sickness in your stomach (nausea) or vomiting.  Your symptoms are not better in 3 days. Return sooner if you are getting worse. SEEK IMMEDIATE MEDICAL CARE IF:   You develop severe vomiting and are unable to keep the medicine down.  You develop severe back or abdominal pain despite taking your medicines.  You begin passing a large amount of blood or clots in your urine.  You feel extremely weak or faint, or you pass out. MAKE SURE YOU:   Understand these instructions.  Will watch your condition.  Will get help right away if you are not doing well or get worse. Document Released: 12/06/2005 Document Revised: 04/22/2014 Document Reviewed: 08/06/2013 [facility] Patient Information 2015 [place], [place]. This information is not intended to replace advice given to you by your health care provider. Make sure you discuss any questions you have with your health care provider.

## 2015-02-18 NOTE — ED Provider Notes (Signed)
CSN: [id number]     Arrival date & time 02/18/15  1427 History   First MD Initiated Contact with Patient 02/18/15 1520     Chief Complaint  Patient presents with  . Dizziness     (Consider location/radiation/quality/duration/timing/severity/associated sxs/prior Treatment) HPI Comments: Patient presents after syncopal event. She has a history of diabetes mellitus, hypertension, seizures and TTP. She states that today she was talking to someone and suddenly passed out. She dropped to the ground. She states she woke back up as she was falling to the ground and does not feel like she hit her head on the ground. She had a little bit of dizziness after the incident. She's had prior seizures that are present in the same way. She's on Keppra for seizures and did not take her Keppra this morning. She also did not take her blood pressure medicine this morning. She's had prior history of strokes but denies any current headache, speech deficit, vision changes, numbness or weakness to her extremities. She states her legs up and swollen over the last few days and she is worried that her kidney function might be worse. She does have a history of kidney problems. She also has a history of TTP and wants to have her platelets checked.  She denies any recent illnesses. She denies any cough or chest congestion. She denies any chest pain or shortness of breath. She denies any fevers or chills. She states she might have hurt her knee little bit when she fell. Feels okay right now. She denies any other injuries from the fall.  Patient is a 44 y.o. female presenting with dizziness.  Dizziness Associated symptoms: no blood in stool, no chest pain, no diarrhea, no headaches, no nausea, no shortness of breath, no vomiting and no weakness     Past Medical History  Diagnosis Date  . Asthma   . Diabetes mellitus without complication   . Hypertension   . T.T.P. syndrome   . Seizures     last seizure Dec 2013  . Hand pain    . Stroke   . TTP (thrombotic thrombocytopenic purpura)   . Renal disorder    Past Surgical History  Procedure Laterality Date  . Tubal ligation    . Tonsillectomy     No family history on file. History  Substance Use Topics  . Smoking status: Never Smoker   . Smokeless tobacco: Never Used  . Alcohol Use: No   OB History    No data available     Review of Systems  Constitutional: Positive for fatigue. Negative for fever, chills and diaphoresis.  HENT: Negative for congestion, rhinorrhea and sneezing.   Eyes: Negative.   Respiratory: Negative for cough, chest tightness and shortness of breath.   Cardiovascular: Positive for leg swelling. Negative for chest pain.  Gastrointestinal: Negative for nausea, vomiting, abdominal pain, diarrhea and blood in stool.  Genitourinary: Negative for frequency, hematuria, flank pain and difficulty urinating.  Musculoskeletal: Positive for arthralgias. Negative for back pain.  Skin: Negative for rash.  Neurological: Positive for dizziness and seizures. Negative for speech difficulty, weakness, numbness and headaches.      Allergies  Latex; Lisinopril; and Vicodin  Home Medications   Prior to Admission medications   Medication Sig Start Date End Date Taking? Authorizing Provider  levETIRAcetam (KEPPRA) 500 MG tablet Take 500 mg by mouth 2 (two) times daily.   Yes Historical Provider, MD  albuterol (PROVENTIL HFA;VENTOLIN HFA) 108 (90 BASE) MCG/ACT inhaler Inhale 1-2 puffs  into the lungs every 6 (six) hours as needed for wheezing. 10/30/12   Tia Oliveri, PA-C  albuterol (PROVENTIL) (2.5 MG/3ML) 0.083% nebulizer solution Take 2.5 mg by nebulization every 6 (six) hours as needed.    Historical Provider, MD  diphenhydrAMINE (BENADRYL) 25 MG tablet Take 1 tablet (25 mg total) by mouth every 6 (six) hours. 02/08/14   Ezequiel Essex, MD  EPINEPHrine (EPIPEN) 0.3 mg/0.3 mL SOAJ injection Inject 0.3 mLs (0.3 mg total) into the muscle as needed (for  severe allergic reaction). 02/08/14   Ezequiel Essex, MD  famotidine (PEPCID) 20 MG tablet Take 1 tablet (20 mg total) by mouth 2 (two) times daily. 02/02/14   [place], NP  famotidine (PEPCID) 20 MG tablet Take 1 tablet (20 mg total) by mouth 2 (two) times daily. 02/08/14   Ezequiel Essex, MD  folic acid (FOLVITE) 1 MG tablet Take 1 mg by mouth daily.    Historical Provider, MD  Gabapentin (NEURONTIN PO) Take by mouth.    Historical Provider, MD  ibuprofen (ADVIL,MOTRIN) 400 MG tablet Take 1 tablet (400 mg total) by mouth every 6 (six) hours as needed for pain. 02/12/13   Varney Biles, MD  insulin detemir (LEVEMIR) 100 UNIT/ML injection Inject into the skin at bedtime.    Historical Provider, MD  levothyroxine (SYNTHROID, LEVOTHROID) 100 MCG tablet Take 100 mcg by mouth daily before breakfast.    Historical Provider, MD  LISINOPRIL PO Take by mouth.    Historical Provider, MD  metoprolol succinate (TOPROL-XL) 25 MG 24 hr tablet Take 25 mg by mouth daily.    Historical Provider, MD  ondansetron (ZOFRAN) 8 MG tablet Take by mouth every 8 (eight) hours as needed for nausea or vomiting.    Historical Provider, MD  oxyCODONE-acetaminophen (PERCOCET/ROXICET) 5-325 MG per tablet Take 1 tablet by mouth every 4 (four) hours as needed for pain. 02/12/13   Varney Biles, MD  PHENOBARBITAL PO Take by mouth.    Historical Provider, MD  potassium chloride (K-DUR) 10 MEQ tablet Take 2 tablets (20 mEq total) by mouth 2 (two) times daily. 07/21/14   Pamella Pert, MD  potassium chloride SA (K-DUR,KLOR-CON) 20 MEQ tablet Take 1 tablet (20 mEq total) by mouth daily. Take two tablets once. 10/30/12   Annalee Genta, PA-C  Vitamin D, Ergocalciferol, (DRISDOL) 50000 UNITS CAPS Take 50,000 Units by mouth.    Historical Provider, MD  WARFARIN SODIUM PO Take by mouth.    Historical Provider, MD   BP 175/78 mmHg  Pulse 84  Temp(Src) 98.2 F (36.8 C) (Oral)  Resp 14  SpO2 100%  LMP 02/17/2015 Physical Exam   Constitutional: She is oriented to person, place, and time. She appears well-developed and well-nourished.  HENT:  Head: Normocephalic and atraumatic.  Eyes: Pupils are equal, round, and reactive to light.  Neck: Normal range of motion. Neck supple.  No pain along the spine  Cardiovascular: Normal rate, regular rhythm and normal heart sounds.   Pulmonary/Chest: Effort normal and breath sounds normal. No respiratory distress. She has no wheezes. She has no rales. She exhibits no tenderness.  Abdominal: Soft. Bowel sounds are normal. There is no tenderness. There is no rebound and no guarding.  Musculoskeletal: Normal range of motion. She exhibits no edema.  Mild tenderness to the anterior left knee. Ligaments appear stable. No significant swelling or deformity. She has 2+ pain edema bilaterally to her lower extremities.  Lymphadenopathy:    She has no cervical adenopathy.  Neurological: She is  alert and oriented to person, place, and time. She has normal strength. No cranial nerve deficit or sensory deficit. GCS eye subscore is 4. GCS verbal subscore is 5. GCS motor subscore is 6.  Finger to nose intact, no pronator drift  Skin: Skin is warm and dry. No rash noted.  Psychiatric: She has a normal mood and affect.    ED Course  Procedures (including critical care time) Labs Review Labs Reviewed  COMPREHENSIVE METABOLIC PANEL - Abnormal; Notable for the following:    Potassium 3.2 (*)    Chloride 119 (*)    CO2 18 (*)    Glucose, Bld 172 (*)    BUN 29 (*)    Creatinine, Ser 2.61 (*)    Calcium 7.3 (*)    Total Protein 4.8 (*)    Albumin 1.8 (*)    Total Bilirubin 0.1 (*)    GFR calc non Af Amer 21 (*)    GFR calc Af Amer 25 (*)    Anion gap 3 (*)    All other components within normal limits  CBC WITH DIFFERENTIAL/PLATELET - Abnormal; Notable for the following:    WBC 14.7 (*)    Hemoglobin 11.9 (*)    MCV 76.3 (*)    MCH 24.7 (*)    RDW 16.3 (*)    Neutro Abs 8.9 (*)     Monocytes Absolute 1.5 (*)    All other components within normal limits  URINALYSIS, ROUTINE W REFLEX MICROSCOPIC - Abnormal; Notable for the following:    Glucose, UA >1000 (*)    Hgb urine dipstick MODERATE (*)    Protein, ur >300 (*)    All other components within normal limits  URINE MICROSCOPIC-ADD ON - Abnormal; Notable for the following:    Squamous Epithelial / LPF FEW (*)    All other components within normal limits  CBG MONITORING, ED - Abnormal; Notable for the following:    Glucose-Capillary 157 (*)    All other components within normal limits    Imaging Review No results found.   EKG Interpretation   Date/Time:  Tuesday February 18 2015 14:46:16 EST Ventricular Rate:  92 PR Interval:  162 QRS Duration: 88 QT Interval:  382 QTC Calculation: 472 R Axis:   12 Text Interpretation:  Normal sinus rhythm Cannot rule out Anterior infarct  , age undetermined Abnormal ECG No significant change since last tracing  Confirmed by Winfred Leeds  MD, SAM [phone]) on 02/18/2015 2:51:11 PM      MDM   Final diagnoses:  Seizure  Renal insufficiency  Hematuria    Patient presents with a drop episode. It sounds like a seizure type episode. She's had similar events in the past. She had no preceding episodes. She didn't have any episodes that sounded more suggestive of an arrhythmia or acute coronary syndrome. She has no neurologic deficits or other symptoms that would be more suggestive of a stroke or TIA. She's completely back to baseline now. She was given dose of Keppra in the ED. I also gave her dose of clonidine for her elevated blood pressure given that she did not take her blood pressure medicine this morning. Her blood pressure is improved in the ED. Her creatinine is slightly more elevated than the last values that were checked here in the ED. She also has some hematuria but does not appear to be infected. Her platelet count is normal. She is discharged home in good condition. She did  not want imaging studies of  her knee. On her medication list it's listed that she is on Coumadin but she says that she doesn't take it anymore. She does take Lasix on a regular basis at home and was given a dose of Lasix here in the ED as well as some IV fluids. She was advised to follow-up with her primary care physician next week. She's going back home on Saturday and she will make an appointment tomorrow to follow-up with her doctor next week. I advised her to have her creatinine rechecked as well as her urine by her primary care physician. I advised her return here if she has any worsening symptoms.    Malvin Johns, MD 02/18/15 2027

## 2015-02-18 NOTE — ED Notes (Signed)
Pt comes in today with a c/o dizziness and LOC. Pt states that she was standing in her living room talking with her daughter when she "blacked out." Pt states that she remembers waking up and having pain and swelling in her legs. Pt states this is not the first time this has happened. Pt states she had a similar episode of this around Christmas time.

## 2015-03-30 ENCOUNTER — Emergency Department (HOSPITAL_BASED_OUTPATIENT_CLINIC_OR_DEPARTMENT_OTHER)
Admission: EM | Admit: 2015-03-30 | Discharge: 2015-03-31 | Disposition: A | Discharge status: Home / Self Care | Payer: Medicare Other | Attending: Emergency Medicine | Admitting: Emergency Medicine

## 2015-03-30 ENCOUNTER — Emergency Department (HOSPITAL_BASED_OUTPATIENT_CLINIC_OR_DEPARTMENT_OTHER): Payer: Medicare Other

## 2015-03-30 ENCOUNTER — Encounter (HOSPITAL_BASED_OUTPATIENT_CLINIC_OR_DEPARTMENT_OTHER): Payer: Self-pay | Admitting: *Deleted

## 2015-03-30 DIAGNOSIS — E119 Type 2 diabetes mellitus without complications: Secondary | ICD-10-CM | POA: Diagnosis not present

## 2015-03-30 DIAGNOSIS — J159 Unspecified bacterial pneumonia: Secondary | ICD-10-CM | POA: Diagnosis not present

## 2015-03-30 DIAGNOSIS — Z91040 Latex allergy status: Secondary | ICD-10-CM | POA: Diagnosis not present

## 2015-03-30 DIAGNOSIS — N179 Acute kidney failure, unspecified: Secondary | ICD-10-CM | POA: Insufficient documentation

## 2015-03-30 DIAGNOSIS — Z794 Long term (current) use of insulin: Secondary | ICD-10-CM | POA: Insufficient documentation

## 2015-03-30 DIAGNOSIS — G40909 Epilepsy, unspecified, not intractable, without status epilepticus: Secondary | ICD-10-CM | POA: Diagnosis not present

## 2015-03-30 DIAGNOSIS — Z7901 Long term (current) use of anticoagulants: Secondary | ICD-10-CM | POA: Insufficient documentation

## 2015-03-30 DIAGNOSIS — Z8739 Personal history of other diseases of the musculoskeletal system and connective tissue: Secondary | ICD-10-CM | POA: Diagnosis not present

## 2015-03-30 DIAGNOSIS — E876 Hypokalemia: Secondary | ICD-10-CM | POA: Diagnosis not present

## 2015-03-30 DIAGNOSIS — J45901 Unspecified asthma with (acute) exacerbation: Secondary | ICD-10-CM | POA: Diagnosis not present

## 2015-03-30 DIAGNOSIS — I129 Hypertensive chronic kidney disease with stage 1 through stage 4 chronic kidney disease, or unspecified chronic kidney disease: Secondary | ICD-10-CM | POA: Insufficient documentation

## 2015-03-30 DIAGNOSIS — Z791 Long term (current) use of non-steroidal anti-inflammatories (NSAID): Secondary | ICD-10-CM | POA: Insufficient documentation

## 2015-03-30 DIAGNOSIS — N189 Chronic kidney disease, unspecified: Secondary | ICD-10-CM | POA: Diagnosis not present

## 2015-03-30 DIAGNOSIS — J189 Pneumonia, unspecified organism: Secondary | ICD-10-CM

## 2015-03-30 DIAGNOSIS — Z8673 Personal history of transient ischemic attack (TIA), and cerebral infarction without residual deficits: Secondary | ICD-10-CM | POA: Insufficient documentation

## 2015-03-30 DIAGNOSIS — Z79899 Other long term (current) drug therapy: Secondary | ICD-10-CM | POA: Insufficient documentation

## 2015-03-30 DIAGNOSIS — R0602 Shortness of breath: Secondary | ICD-10-CM | POA: Diagnosis present

## 2015-03-30 LAB — CBC WITH DIFFERENTIAL/PLATELET
BASOS PCT: 0 % (ref 0–1)
Basophils Absolute: 0 10*3/uL (ref 0.0–0.1)
Eosinophils Absolute: 0 10*3/uL (ref 0.0–0.7)
Eosinophils Relative: 0 % (ref 0–5)
HCT: 34.7 % — ABNORMAL LOW (ref 36.0–46.0)
Hemoglobin: 11.3 g/dL — ABNORMAL LOW (ref 12.0–15.0)
Lymphocytes Relative: 4 % — ABNORMAL LOW (ref 12–46)
Lymphs Abs: 0.9 10*3/uL (ref 0.7–4.0)
MCH: 25.1 pg — AB (ref 26.0–34.0)
MCHC: 32.6 g/dL (ref 30.0–36.0)
MCV: 77.1 fL — AB (ref 78.0–100.0)
MONO ABS: 1.2 10*3/uL — AB (ref 0.1–1.0)
Monocytes Relative: 6 % (ref 3–12)
NEUTROS PCT: 90 % — AB (ref 43–77)
Neutro Abs: 19 10*3/uL — ABNORMAL HIGH (ref 1.7–7.7)
PLATELETS: 338 10*3/uL (ref 150–400)
RBC: 4.5 MIL/uL (ref 3.87–5.11)
RDW: 14.8 % (ref 11.5–15.5)
WBC: 21.1 10*3/uL — ABNORMAL HIGH (ref 4.0–10.5)

## 2015-03-30 LAB — COMPREHENSIVE METABOLIC PANEL
ALT: 10 U/L (ref 0–35)
AST: 16 U/L (ref 0–37)
Albumin: 1.5 g/dL — ABNORMAL LOW (ref 3.5–5.2)
Alkaline Phosphatase: 73 U/L (ref 39–117)
Anion gap: 10 (ref 5–15)
BILIRUBIN TOTAL: 0.3 mg/dL (ref 0.3–1.2)
BUN: 33 mg/dL — ABNORMAL HIGH (ref 6–23)
CO2: 18 mmol/L — AB (ref 19–32)
Calcium: 7.3 mg/dL — ABNORMAL LOW (ref 8.4–10.5)
Chloride: 112 mmol/L (ref 96–112)
Creatinine, Ser: 3.53 mg/dL — ABNORMAL HIGH (ref 0.50–1.10)
GFR calc Af Amer: 17 mL/min — ABNORMAL LOW (ref >90)
GFR calc non Af Amer: 15 mL/min — ABNORMAL LOW (ref >90)
Glucose, Bld: 223 mg/dL — ABNORMAL HIGH (ref 70–99)
Potassium: 2.7 mmol/L — CL (ref 3.5–5.1)
Sodium: 140 mmol/L (ref 135–145)
Total Protein: 4.4 g/dL — ABNORMAL LOW (ref 6.0–8.3)

## 2015-03-30 LAB — TROPONIN I: Troponin I: <0.03 ng/mL (ref <0.031)

## 2015-03-30 LAB — PROTIME-INR
INR: 0.98 (ref 0.00–1.49)
PROTHROMBIN TIME: 13 s (ref 11.6–15.2)

## 2015-03-30 LAB — I-STAT CG4 LACTIC ACID, ED: Lactic Acid, Venous: 2.63 mmol/L (ref 0.5–2.0)

## 2015-03-30 MED ORDER — IPRATROPIUM-ALBUTEROL 0.5-2.5 (3) MG/3ML IN SOLN: 3.0000 mL | RESPIRATORY_TRACT | Status: DC | PRN

## 2015-03-30 MED ORDER — IPRATROPIUM-ALBUTEROL 0.5-2.5 (3) MG/3ML IN SOLN
3.0000 mL | RESPIRATORY_TRACT | Status: DC
  Administered 2015-03-30: 3 mL via RESPIRATORY_TRACT
  Filled 2015-03-30: qty 3

## 2015-03-30 MED ORDER — CEFTRIAXONE SODIUM 1 G IJ SOLR
INTRAMUSCULAR | Status: AC
  Filled 2015-03-30: qty 10

## 2015-03-30 MED ORDER — SODIUM CHLORIDE 0.9 % IV BOLUS (SEPSIS)
1000.0000 mL | Freq: Once | INTRAVENOUS | Status: AC
  Administered 2015-03-30: 1000 mL via INTRAVENOUS

## 2015-03-30 MED ORDER — POTASSIUM CHLORIDE 10 MEQ/100ML IV SOLN
10.0000 meq | INTRAVENOUS | Status: AC
  Administered 2015-03-30: 10 meq via INTRAVENOUS
  Filled 2015-03-30: qty 100

## 2015-03-30 MED ORDER — POTASSIUM CHLORIDE CRYS ER 20 MEQ PO TBCR
40.0000 meq | EXTENDED_RELEASE_TABLET | Freq: Once | ORAL | Status: AC
  Administered 2015-03-30: 40 meq via ORAL
  Filled 2015-03-30: qty 2

## 2015-03-30 MED ORDER — DEXTROSE 5 % IV SOLN
500.0000 mg | Freq: Once | INTRAVENOUS | Status: DC
  Filled 2015-03-30: qty 500

## 2015-03-30 MED ORDER — DEXTROSE 5 % IV SOLN
1.0000 g | Freq: Once | INTRAVENOUS | Status: AC
  Administered 2015-03-30: 1 g via INTRAVENOUS

## 2015-03-30 NOTE — ED Provider Notes (Signed)
CSN: [id number]     Arrival date & time 03/30/15  [id number] History  This chart was scribed for Wandra Arthurs, MD by Edison Simon, ED Scribe. This patient was seen in room MH06/MH06 and the patient's care was started at 8:36 PM.    Chief Complaint  Patient presents with  . Shortness of Breath   The history is provided by the patient. No language interpreter was used.    HPI Comments: Crystal Bradshaw is a 44 y.o. female with history of asthma who presents to the Emergency Department complaining of SOB with onset this morning. She reports associated cough that produces green mucous, fever, chills, and vomiting. She states she has had several bowel movements but denies diarrhea. She states she has not been able to tolerate PO until she had some water recently. She notes history of seizures may have had one today, but is unsure; she was not able to tolerate Keppra today. She states she was around a sick child yesterday at a family gathering. She is unsure if symptoms feel like asthma attack because she has not had asthma flare recently; she has not tried her inhaler because she is from out of town and did not bring it. She also reports history of DM, HTN, TTP syndrome, CVA, and renal disorder. She states creatinine usually runs approximately 2.7. She has not checked her blood sugar today.   Past Medical History  Diagnosis Date  . Asthma   . Diabetes mellitus without complication   . Hypertension   . T.T.P. syndrome   . Seizures     last seizure Dec 2013  . Hand pain   . Stroke   . TTP (thrombotic thrombocytopenic purpura)   . Renal disorder    Past Surgical History  Procedure Laterality Date  . Tubal ligation    . Tonsillectomy     No family history on file. History  Substance Use Topics  . Smoking status: Never Smoker   . Smokeless tobacco: Never Used  . Alcohol Use: No   OB History    No data available     Review of Systems  Constitutional: Positive for fever and chills.   Respiratory: Positive for cough and shortness of breath.   Gastrointestinal: Positive for nausea and vomiting. Negative for diarrhea.  All other systems reviewed and are negative.     Allergies  Latex; Lisinopril; and Vicodin  Home Medications   Prior to Admission medications   Medication Sig Start Date End Date Taking? Authorizing Provider  albuterol (PROVENTIL HFA;VENTOLIN HFA) 108 (90 BASE) MCG/ACT inhaler Inhale 1-2 puffs into the lungs every 6 (six) hours as needed for wheezing. 10/30/12  Yes Tia L Oliveri, PA-C  albuterol (PROVENTIL) (2.5 MG/3ML) 0.083% nebulizer solution Take 2.5 mg by nebulization every 6 (six) hours as needed.   Yes Historical Provider, MD  diphenhydrAMINE (BENADRYL) 25 MG tablet Take 1 tablet (25 mg total) by mouth every 6 (six) hours. 02/08/14  Yes Ezequiel Essex, MD  EPINEPHrine (EPIPEN) 0.3 mg/0.3 mL SOAJ injection Inject 0.3 mLs (0.3 mg total) into the muscle as needed (for severe allergic reaction). 02/08/14  Yes Ezequiel Essex, MD  famotidine (PEPCID) 20 MG tablet Take 1 tablet (20 mg total) by mouth 2 (two) times daily. 02/02/14  Yes Hope Bunnie Pion, NP  Gabapentin (NEURONTIN PO) Take by mouth.   Yes Historical Provider, MD  ibuprofen (ADVIL,MOTRIN) 400 MG tablet Take 1 tablet (400 mg total) by mouth every 6 (six) hours as needed for  pain. 02/12/13  Yes Varney Biles, MD  insulin detemir (LEVEMIR) 100 UNIT/ML injection Inject into the skin at bedtime.   Yes Historical Provider, MD  levETIRAcetam (KEPPRA) 500 MG tablet Take 500 mg by mouth 2 (two) times daily.   Yes Historical Provider, MD  levothyroxine (SYNTHROID, LEVOTHROID) 100 MCG tablet Take 100 mcg by mouth daily before breakfast.   Yes Historical Provider, MD  metoprolol succinate (TOPROL-XL) 25 MG 24 hr tablet Take 25 mg by mouth daily.   Yes Historical Provider, MD  ondansetron (ZOFRAN) 8 MG tablet Take by mouth every 8 (eight) hours as needed for nausea or vomiting.   Yes Historical Provider, MD   potassium chloride SA (K-DUR,KLOR-CON) 20 MEQ tablet Take 1 tablet (20 mEq total) by mouth daily. Take two tablets once. 10/30/12  Yes Tia L Oliveri, PA-C  Vitamin D, Ergocalciferol, (DRISDOL) 50000 UNITS CAPS Take 50,000 Units by mouth.   Yes Historical Provider, MD  famotidine (PEPCID) 20 MG tablet Take 1 tablet (20 mg total) by mouth 2 (two) times daily. 02/08/14   Ezequiel Essex, MD  folic acid (FOLVITE) 1 MG tablet Take 1 mg by mouth daily.    Historical Provider, MD  LISINOPRIL PO Take by mouth.    Historical Provider, MD  oxyCODONE-acetaminophen (PERCOCET/ROXICET) 5-325 MG per tablet Take 1 tablet by mouth every 4 (four) hours as needed for pain. 02/12/13   Varney Biles, MD  PHENOBARBITAL PO Take by mouth.    Historical Provider, MD  potassium chloride (K-DUR) 10 MEQ tablet Take 2 tablets (20 mEq total) by mouth 2 (two) times daily. 07/21/14   Pamella Pert, MD  WARFARIN SODIUM PO Take by mouth.    Historical Provider, MD   BP 142/77 mmHg  Pulse 103  Temp(Src) 99.4 F (37.4 C) (Oral)  Resp 24  Ht 5\' 7"  (1.702 m)  Wt 237 lb (107.502 kg)  BMI 37.11 kg/m2  SpO2 97%  LMP 03/16/2015 Physical Exam  Constitutional: She is oriented to person, place, and time. She appears well-developed and well-nourished.  HENT:  Head: Normocephalic and atraumatic.  Eyes: Conjunctivae are normal.  Neck: Normal range of motion. Neck supple.  Pulmonary/Chest: Effort normal.  Musculoskeletal: Normal range of motion.  Neurological: She is alert and oriented to person, place, and time.  Skin: Skin is warm and dry.  Psychiatric: She has a normal mood and affect.  Nursing note and vitals reviewed.   ED Course  Procedures (including critical care time)  DIAGNOSTIC STUDIES: Oxygen Saturation is 98% on room air, normal by my interpretation.    COORDINATION OF CARE: 8:42 PM Discussed treatment plan with patient at beside, the patient agrees with the plan and has no further questions at this  time.   Labs Review Labs Reviewed  CBC WITH DIFFERENTIAL/PLATELET - Abnormal; Notable for the following:    WBC 21.1 (*)    Hemoglobin 11.3 (*)    HCT 34.7 (*)    MCV 77.1 (*)    MCH 25.1 (*)    Neutrophils Relative % 90 (*)    Neutro Abs 19.0 (*)    Lymphocytes Relative 4 (*)    Monocytes Absolute 1.2 (*)    All other components within normal limits  COMPREHENSIVE METABOLIC PANEL - Abnormal; Notable for the following:    Potassium 2.7 (*)    CO2 18 (*)    Glucose, Bld 223 (*)    BUN 33 (*)    Creatinine, Ser 3.53 (*)    Calcium 7.3 (*)  Total Protein 4.4 (*)    Albumin 1.5 (*)    GFR calc non Af Amer 15 (*)    GFR calc Af Amer 17 (*)    All other components within normal limits  CULTURE, BLOOD (ROUTINE X 2)  CULTURE, BLOOD (ROUTINE X 2)  TROPONIN I  PROTIME-INR  I-STAT CG4 LACTIC ACID, ED    Imaging Review Dg Chest 2 View  03/30/2015   CLINICAL DATA:  Shortness of breath, cough, fever and chills.  EXAM: CHEST  2 VIEW  COMPARISON:  07/21/2014  FINDINGS: Patchy right perihilar opacities concerning for pneumonia. The left lung is clear. Cardiomediastinal contours are normal. Pulmonary vasculature is normal. There is mild central bronchial thickening. No pleural effusion or pneumothorax. No acute osseous abnormalities are seen.  IMPRESSION: Patchy right perihilar opacities concerning for pneumonia.   Electronically Signed   By: Jeb Levering M.D.   On: 03/30/2015 22:13     EKG Interpretation   Date/Time:  Sunday March 30 2015 21:29:04 EDT Ventricular Rate:  105 PR Interval:  170 QRS Duration: 94 QT Interval:  348 QTC Calculation: 459 R Axis:   91 Text Interpretation:  Sinus tachycardia Rightward axis T wave abnormality,  consider inferior ischemia Abnormal ECG No significant change since last  tracing Confirmed by Emiliya Chretien  MD, Rozella Servello ([id number]) on 03/30/2015 9:33:05 PM      MDM   Final diagnoses:  None    Crystal Bradshaw is a 44 y.o. female here with cough,  shortness of breath, chills. Concerned for bronchitis vs pneumonia. Will get labs, CXR and reassess.   11:00 PM Labs showed WBC 21. Lactate elevated at 2.6. Bicarb 18. K 2.7. Cr 3.5 (baseline 2.5). CXR showed pneumonia. Will admit for CAP. Patient still tachy. Request transfer to [facility] since she was there last August.    I personally performed the services described in this documentation, which was scribed in my presence. The recorded information has been reviewed and is accurate.   Wandra Arthurs, MD 03/30/15 [phone]

## 2015-03-30 NOTE — ED Notes (Signed)
Pt reports SOB since this morning- hx of asthma- eval by RT in triage- coughing up green mucous- also reports chills and vomited x 3 today

## 2015-03-30 NOTE — ED Notes (Signed)
LAC drawn and results of 2.63 hand delivered to Dr. Darl Householder @ [phone]

## 2016-03-19 ENCOUNTER — Emergency Department (HOSPITAL_COMMUNITY): Payer: Medicare Other

## 2016-03-19 ENCOUNTER — Emergency Department (HOSPITAL_COMMUNITY)
Admission: EM | Admit: 2016-03-19 | Discharge: 2016-03-20 | Disposition: A | Discharge status: Home / Self Care | Payer: Medicare Other | Attending: Emergency Medicine | Admitting: Emergency Medicine

## 2016-03-19 ENCOUNTER — Encounter (HOSPITAL_COMMUNITY): Payer: Self-pay | Admitting: Emergency Medicine

## 2016-03-19 DIAGNOSIS — N186 End stage renal disease: Secondary | ICD-10-CM | POA: Insufficient documentation

## 2016-03-19 DIAGNOSIS — R0602 Shortness of breath: Secondary | ICD-10-CM

## 2016-03-19 DIAGNOSIS — E119 Type 2 diabetes mellitus without complications: Secondary | ICD-10-CM | POA: Insufficient documentation

## 2016-03-19 DIAGNOSIS — I120 Hypertensive chronic kidney disease with stage 5 chronic kidney disease or end stage renal disease: Secondary | ICD-10-CM | POA: Diagnosis not present

## 2016-03-19 DIAGNOSIS — Z992 Dependence on renal dialysis: Secondary | ICD-10-CM | POA: Diagnosis not present

## 2016-03-19 DIAGNOSIS — Z91040 Latex allergy status: Secondary | ICD-10-CM | POA: Diagnosis not present

## 2016-03-19 DIAGNOSIS — J45901 Unspecified asthma with (acute) exacerbation: Secondary | ICD-10-CM | POA: Insufficient documentation

## 2016-03-19 DIAGNOSIS — Z79899 Other long term (current) drug therapy: Secondary | ICD-10-CM | POA: Diagnosis not present

## 2016-03-19 DIAGNOSIS — Z8673 Personal history of transient ischemic attack (TIA), and cerebral infarction without residual deficits: Secondary | ICD-10-CM | POA: Diagnosis not present

## 2016-03-19 DIAGNOSIS — Z794 Long term (current) use of insulin: Secondary | ICD-10-CM | POA: Diagnosis not present

## 2016-03-19 DIAGNOSIS — Z862 Personal history of diseases of the blood and blood-forming organs and certain disorders involving the immune mechanism: Secondary | ICD-10-CM | POA: Diagnosis not present

## 2016-03-19 LAB — CBC WITH DIFFERENTIAL/PLATELET
Basophils Absolute: 0 10*3/uL (ref 0.0–0.1)
Basophils Relative: 0 %
EOS PCT: 2 %
Eosinophils Absolute: 0.2 10*3/uL (ref 0.0–0.7)
HEMATOCRIT: 33.5 % — AB (ref 36.0–46.0)
Hemoglobin: 10.4 g/dL — ABNORMAL LOW (ref 12.0–15.0)
Lymphocytes Relative: 28 %
Lymphs Abs: 2.7 10*3/uL (ref 0.7–4.0)
MCH: 23.6 pg — AB (ref 26.0–34.0)
MCHC: 31 g/dL (ref 30.0–36.0)
MCV: 76 fL — ABNORMAL LOW (ref 78.0–100.0)
MONOS PCT: 9 %
Monocytes Absolute: 0.8 10*3/uL (ref 0.1–1.0)
Neutro Abs: 5.9 10*3/uL (ref 1.7–7.7)
Neutrophils Relative %: 61 %
PLATELETS: 258 10*3/uL (ref 150–400)
RBC: 4.41 MIL/uL (ref 3.87–5.11)
RDW: 17.1 % — ABNORMAL HIGH (ref 11.5–15.5)
WBC: 9.6 10*3/uL (ref 4.0–10.5)

## 2016-03-19 LAB — COMPREHENSIVE METABOLIC PANEL
ALBUMIN: 3.7 g/dL (ref 3.5–5.0)
ALT: 8 U/L — ABNORMAL LOW (ref 14–54)
AST: 10 U/L — AB (ref 15–41)
Alkaline Phosphatase: 121 U/L (ref 38–126)
Anion gap: 9 (ref 5–15)
BILIRUBIN TOTAL: 0.7 mg/dL (ref 0.3–1.2)
BUN: 47 mg/dL — AB (ref 6–20)
CO2: 20 mmol/L — AB (ref 22–32)
Calcium: 8.3 mg/dL — ABNORMAL LOW (ref 8.9–10.3)
Chloride: 110 mmol/L (ref 101–111)
Creatinine, Ser: 4.06 mg/dL — ABNORMAL HIGH (ref 0.44–1.00)
GFR calc Af Amer: 14 mL/min — ABNORMAL LOW (ref >60)
GFR calc non Af Amer: 12 mL/min — ABNORMAL LOW (ref >60)
GLUCOSE: 184 mg/dL — AB (ref 65–99)
POTASSIUM: 4.6 mmol/L (ref 3.5–5.1)
SODIUM: 139 mmol/L (ref 135–145)
Total Protein: 7.3 g/dL (ref 6.5–8.1)

## 2016-03-19 MED ORDER — ALBUTEROL SULFATE (2.5 MG/3ML) 0.083% IN NEBU
5.0000 mg | INHALATION_SOLUTION | Freq: Once | RESPIRATORY_TRACT | Status: AC
  Administered 2016-03-19: 5 mg via RESPIRATORY_TRACT

## 2016-03-19 MED ORDER — ALBUTEROL SULFATE (2.5 MG/3ML) 0.083% IN NEBU
INHALATION_SOLUTION | RESPIRATORY_TRACT | Status: DC
Start: 2016-03-19 — End: 2016-03-20
  Filled 2016-03-19: qty 6

## 2016-03-19 NOTE — ED Notes (Addendum)
Pt last dialyzed on Monday. She states she did not go on Wednesday due to a "family emergency". She reports SOB and swollen eyes.

## 2016-03-19 NOTE — ED Notes (Signed)
Pt. reports SOB onset today , denies cough or congestion , no fever or chills , pt. stated that she did not go her hemodialysis today .

## 2016-03-19 NOTE — ED Provider Notes (Signed)
CSN: [id number]     Arrival date & time 03/19/16  2001 History  By signing my name below, I, Julien Nordmann, attest that this documentation has been prepared under the direction and in the presence of Jola Schmidt, MD. Electronically Signed: Julien Nordmann, ED Scribe. 03/19/2016. 11:42 PM.     Chief Complaint  Patient presents with  . Shortness of Breath      The history is provided by the patient. No language interpreter was used.   HPI Comments: Crystal Bradshaw is a 45 y.o. female who has a PMHx of DM, HTN, TTP syndrome, and renal disorder presents to the Emergency Department complaining of constant, gradual worsening, moderate, shortness of breath onset today. She reports feeling swollen in her extremities. Pt endorses feeling increased short of breath whenever she ambulates. Pt is a MWF dialysis pt and she missed her dialysis on Wednesday and today. Pt has been a dialysis for almost one year. She receives her treatment through a cathteter in her right upper chest. She states that her normal weight is 120 kilos.  Past Medical History  Diagnosis Date  . Asthma   . Diabetes mellitus without complication ([place])   . Hypertension   . T.T.P. syndrome ([place])   . Seizures ([place])     last seizure Dec 2013  . Hand pain   . Stroke ([place])   . 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 Renal disorder    Past Surgical History  Procedure Laterality Date  . Tubal ligation    . Tonsillectomy     No family history on file. Social History  Substance Use Topics  . Smoking status: Never Smoker   . Smokeless tobacco: Never Used  . Alcohol Use: No   OB History    No data available     Review of Systems  A complete 10 system review of systems was obtained and all systems are negative except as noted in the HPI and PMH.   Allergies  Latex; Lisinopril; and Vicodin  Home Medications   Prior to Admission medications   Medication Sig Start Date End Date Taking? Authorizing Provider   albuterol (PROVENTIL HFA;VENTOLIN HFA) 108 (90 BASE) MCG/ACT inhaler Inhale 1-2 puffs into the lungs every 6 (six) hours as needed for wheezing. 10/30/12   Tia L Oliveri, PA-C  albuterol (PROVENTIL) (2.5 MG/3ML) 0.083% nebulizer solution Take 2.5 mg by nebulization every 6 (six) hours as needed.    Historical Provider, MD  diphenhydrAMINE (BENADRYL) 25 MG tablet Take 1 tablet (25 mg total) by mouth every 6 (six) hours. 02/08/14   Ezequiel Essex, MD  EPINEPHrine (EPIPEN) 0.3 mg/0.3 mL SOAJ injection Inject 0.3 mLs (0.3 mg total) into the muscle as needed (for severe allergic reaction). 02/08/14   Ezequiel Essex, MD  famotidine (PEPCID) 20 MG tablet Take 1 tablet (20 mg total) by mouth 2 (two) times daily. 02/02/14   [place], NP  famotidine (PEPCID) 20 MG tablet Take 1 tablet (20 mg total) by mouth 2 (two) times daily. 02/08/14   Ezequiel Essex, MD  folic acid (FOLVITE) 1 MG tablet Take 1 mg by mouth daily.    Historical Provider, MD  Gabapentin (NEURONTIN PO) Take by mouth.    Historical Provider, MD  ibuprofen (ADVIL,MOTRIN) 400 MG tablet Take 1 tablet (400 mg total) by mouth every 6 (six) hours as needed for pain. 02/12/13   Varney Biles, MD  insulin detemir (LEVEMIR) 100 UNIT/ML injection Inject into the skin at bedtime.  Historical Provider, MD  levETIRAcetam (KEPPRA) 500 MG tablet Take 500 mg by mouth 2 (two) times daily.    Historical Provider, MD  levothyroxine (SYNTHROID, LEVOTHROID) 100 MCG tablet Take 100 mcg by mouth daily before breakfast.    Historical Provider, MD  LISINOPRIL PO Take by mouth.    Historical Provider, MD  metoprolol succinate (TOPROL-XL) 25 MG 24 hr tablet Take 25 mg by mouth daily.    Historical Provider, MD  ondansetron (ZOFRAN) 8 MG tablet Take by mouth every 8 (eight) hours as needed for nausea or vomiting.    Historical Provider, MD  oxyCODONE-acetaminophen (PERCOCET/ROXICET) 5-325 MG per tablet Take 1 tablet by mouth every 4 (four) hours as needed for pain.  02/12/13   Varney Biles, MD  PHENOBARBITAL PO Take by mouth.    Historical Provider, MD  potassium chloride (K-DUR) 10 MEQ tablet Take 2 tablets (20 mEq total) by mouth 2 (two) times daily. 07/21/14   Pamella Pert, MD  potassium chloride SA (K-DUR,KLOR-CON) 20 MEQ tablet Take 1 tablet (20 mEq total) by mouth daily. Take two tablets once. 10/30/12   Tia L Oliveri, PA-C  Vitamin D, Ergocalciferol, (DRISDOL) 50000 UNITS CAPS Take 50,000 Units by mouth.    Historical Provider, MD  WARFARIN SODIUM PO Take by mouth.    Historical Provider, MD   Triage vitals: BP 161/71 mmHg  Pulse 102  Temp(Src) 98.8 F (37.1 C) (Oral)  Resp 22  Ht 5\' 7"  (1.702 m)  Wt 279 lb (126.554 kg)  BMI 43.69 kg/m2  SpO2 100% Physical Exam  Constitutional: She is oriented to person, place, and time. She appears well-developed and well-nourished. No distress.  HENT:  Head: Normocephalic and atraumatic.  Eyes: EOM are normal.  Neck: Normal range of motion.  Cardiovascular: Normal rate, regular rhythm and normal heart sounds.   Pulmonary/Chest: Effort normal and breath sounds normal.  Abdominal: Soft. She exhibits no distension. There is no tenderness.  Musculoskeletal: Normal range of motion.  Perm catheter in right upper chest  Neurological: She is alert and oriented to person, place, and time.  1+ edema in lower extremities  Skin: Skin is warm and dry.  Psychiatric: She has a normal mood and affect. Judgment normal.  Nursing note and vitals reviewed.   ED Course  Procedures  DIAGNOSTIC STUDIES: Oxygen Saturation is 100% on RA, normal by my interpretation.  COORDINATION OF CARE:  11:41 PM Discussed treatment plan with pt at bedside and pt agreed to plan.  Labs Review Labs Reviewed  COMPREHENSIVE METABOLIC PANEL - Abnormal; Notable for the following:    CO2 20 (*)    Glucose, Bld 184 (*)    BUN 47 (*)    Creatinine, Ser 4.06 (*)    Calcium 8.3 (*)    AST 10 (*)    ALT 8 (*)    GFR calc non Af Amer  12 (*)    GFR calc Af Amer 14 (*)    All other components within normal limits  CBC WITH DIFFERENTIAL/PLATELET - Abnormal; Notable for the following:    Hemoglobin 10.4 (*)    HCT 33.5 (*)    MCV 76.0 (*)    MCH 23.6 (*)    RDW 17.1 (*)    All other components within normal limits    Imaging Review Dg Chest 2 View  03/19/2016  CLINICAL DATA:  SOB today, missed dialysis today, last one was x 2 days ago. HTN, DM, nonsmoker. Hx asthma EXAM: CHEST  2 VIEW  COMPARISON:  03/30/2015 FINDINGS: Lateral view degraded by motion, patient size, and arm position. Mild right hemidiaphragm elevation. Dialysis catheter with tips at low SVC and cavoatrial junction. Midline trachea. Normal heart size and mediastinal contours. No pleural effusion or pneumothorax. Clear lungs. IMPRESSION: No acute cardiopulmonary disease. Electronically Signed   By: Abigail Miyamoto M.D.   On: 03/19/2016 21:29   I have personally reviewed and evaluated these images and lab results as part of my medical decision-making.   EKG Interpretation   Date/Time:  Friday March 19 2016 20:29:15 EDT Ventricular Rate:  92 PR Interval:  178 QRS Duration: 82 QT Interval:  362 QTC Calculation: 447 R Axis:   93 Text Interpretation:  Normal sinus rhythm Rightward axis Borderline ECG No  significant change was found Confirmed by Adrieana Fennelly  MD, Lennette Bihari ([id number]) on  03/19/2016 11:04:12 PM      MDM   Final diagnoses:  ESRD on dialysis (HCC)  SOB (shortness of breath)   12:32 AM Spoke with Dr Marval Regal who reports no indication for acute dialysis at this time As patient is without hypoxia, without edema on chest x-ray, and with a  normal potassium. Patient informed to schedule outpatient dialysis with her dialysis center in [place] or return to the emergency department for new or worsening symptoms   I personally performed the services described in this documentation, which was scribed in my presence. The recorded information has been  reviewed and is accurate.      Jola Schmidt, MD 03/20/16 [phone]

## 2020-06-25 ENCOUNTER — Emergency Department (HOSPITAL_BASED_OUTPATIENT_CLINIC_OR_DEPARTMENT_OTHER)
Admission: EM | Admit: 2020-06-25 | Discharge: 2020-06-25 | Disposition: A | Discharge status: Home / Self Care | Payer: Medicare Other | Attending: Emergency Medicine | Admitting: Emergency Medicine

## 2020-06-25 ENCOUNTER — Other Ambulatory Visit: Payer: Self-pay

## 2020-06-25 ENCOUNTER — Encounter (HOSPITAL_BASED_OUTPATIENT_CLINIC_OR_DEPARTMENT_OTHER): Payer: Self-pay

## 2020-06-25 DIAGNOSIS — Z5321 Procedure and treatment not carried out due to patient leaving prior to being seen by health care provider: Secondary | ICD-10-CM | POA: Insufficient documentation

## 2020-06-25 DIAGNOSIS — N939 Abnormal uterine and vaginal bleeding, unspecified: Secondary | ICD-10-CM | POA: Insufficient documentation

## 2020-06-25 NOTE — ED Triage Notes (Signed)
Pt c/o vaginal bleeding x 1 month-states she has appt with GYN in 2 days-pt also c/o "pinched nerve in my back"-to triage in w/c

## 2021-12-15 ENCOUNTER — Emergency Department (HOSPITAL_BASED_OUTPATIENT_CLINIC_OR_DEPARTMENT_OTHER): Payer: Medicare Other

## 2021-12-15 ENCOUNTER — Encounter (HOSPITAL_BASED_OUTPATIENT_CLINIC_OR_DEPARTMENT_OTHER): Payer: Self-pay | Admitting: *Deleted

## 2021-12-15 ENCOUNTER — Emergency Department (HOSPITAL_BASED_OUTPATIENT_CLINIC_OR_DEPARTMENT_OTHER)
Admission: EM | Admit: 2021-12-15 | Discharge: 2021-12-15 | Disposition: A | Discharge status: Home / Self Care | Payer: Medicare Other | Attending: Student | Admitting: Student

## 2021-12-15 ENCOUNTER — Other Ambulatory Visit (HOSPITAL_BASED_OUTPATIENT_CLINIC_OR_DEPARTMENT_OTHER): Payer: Self-pay

## 2021-12-15 ENCOUNTER — Other Ambulatory Visit: Payer: Self-pay

## 2021-12-15 DIAGNOSIS — Z7901 Long term (current) use of anticoagulants: Secondary | ICD-10-CM | POA: Insufficient documentation

## 2021-12-15 DIAGNOSIS — S62666A Nondisplaced fracture of distal phalanx of right little finger, initial encounter for closed fracture: Secondary | ICD-10-CM | POA: Insufficient documentation

## 2021-12-15 DIAGNOSIS — Y9302 Activity, running: Secondary | ICD-10-CM | POA: Insufficient documentation

## 2021-12-15 DIAGNOSIS — E119 Type 2 diabetes mellitus without complications: Secondary | ICD-10-CM | POA: Insufficient documentation

## 2021-12-15 DIAGNOSIS — N186 End stage renal disease: Secondary | ICD-10-CM | POA: Insufficient documentation

## 2021-12-15 DIAGNOSIS — S39012A Strain of muscle, fascia and tendon of lower back, initial encounter: Secondary | ICD-10-CM

## 2021-12-15 DIAGNOSIS — I120 Hypertensive chronic kidney disease with stage 5 chronic kidney disease or end stage renal disease: Secondary | ICD-10-CM | POA: Insufficient documentation

## 2021-12-15 DIAGNOSIS — Z992 Dependence on renal dialysis: Secondary | ICD-10-CM | POA: Diagnosis not present

## 2021-12-15 DIAGNOSIS — W01198A Fall on same level from slipping, tripping and stumbling with subsequent striking against other object, initial encounter: Secondary | ICD-10-CM | POA: Diagnosis not present

## 2021-12-15 DIAGNOSIS — Z794 Long term (current) use of insulin: Secondary | ICD-10-CM | POA: Diagnosis not present

## 2021-12-15 DIAGNOSIS — S6991XA Unspecified injury of right wrist, hand and finger(s), initial encounter: Secondary | ICD-10-CM | POA: Diagnosis present

## 2021-12-15 DIAGNOSIS — Z79899 Other long term (current) drug therapy: Secondary | ICD-10-CM | POA: Insufficient documentation

## 2021-12-15 DIAGNOSIS — S63613A Unspecified sprain of left middle finger, initial encounter: Secondary | ICD-10-CM | POA: Diagnosis not present

## 2021-12-15 DIAGNOSIS — J45909 Unspecified asthma, uncomplicated: Secondary | ICD-10-CM | POA: Insufficient documentation

## 2021-12-15 DIAGNOSIS — Z91040 Latex allergy status: Secondary | ICD-10-CM | POA: Insufficient documentation

## 2021-12-15 DIAGNOSIS — W19XXXA Unspecified fall, initial encounter: Secondary | ICD-10-CM

## 2021-12-15 DIAGNOSIS — K5900 Constipation, unspecified: Secondary | ICD-10-CM | POA: Diagnosis not present

## 2021-12-15 MED ORDER — LIDOCAINE 5 % EX PTCH
1.0000 | MEDICATED_PATCH | CUTANEOUS | 0 refills | Status: AC
  Filled 2021-12-15: qty 30, 30d supply, fill #0

## 2021-12-15 MED ORDER — GLYCERIN (LAXATIVE) 2.1 G RE SUPP
1.0000 | Freq: Once | RECTAL | Status: AC
  Administered 2021-12-15: 17:00:00 1 via RECTAL
  Filled 2021-12-15: qty 1

## 2021-12-15 NOTE — Discharge Instructions (Signed)
Follow-up with orthopedics, call to schedule an appointment. Wear splint on right fifth finger.  Buddy tape left third finger as needed for comfort. Apply Lidoderm patch to your back.  Follow-up with your primary care provider regarding your back, you may benefit from physical therapy.

## 2021-12-15 NOTE — ED Notes (Signed)
Pt reports excellent results with suppository

## 2021-12-15 NOTE — ED Triage Notes (Signed)
She was running from a dog and fell 2 months ago. C.o back injury. She has not seen a doctor about the pain until today.

## 2021-12-15 NOTE — ED Provider Notes (Signed)
[facility] EMERGENCY DEPARTMENT Provider Note   CSN: [id number] Arrival date & time: 12/15/21  1350     History Chief Complaint  Patient presents with   Fall   Back Pain    Crystal Bradshaw is a 50 y.o. female.  50 year old female with history of diabetes, hypertension, end-stage renal disease presents with complaint of pain in her right little finger, left middle finger and low back after a fall which occurred 2 months ago when she tripped and fell forward landing on her hands.  Patient tried to follow-up with her primary care provider however the appointment was canceled by the provider's office.  Also states she is a dialysis patient and is concerned she is constipated from having too much fluid taken off.  Patient does make urine, and has been taking senna without improvement.  No other injuries, complaints, concerns at this time.      Past Medical History:  Diagnosis Date   Asthma    Diabetes mellitus without complication ([place])    Hand pain    Hypertension    Renal disorder    Seizures ([place])    last seizure Dec 2013   Stroke ([place])    T.T.P. 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   Past Surgical History:  Procedure Laterality Date   TONSILLECTOMY     TUBAL LIGATION       OB History   No obstetric history on file.     No family history on file.  Social History   Tobacco Use   Smoking status: Never   Smokeless tobacco: Never  Vaping Use   Vaping Use: Never used  Substance Use Topics   Alcohol use: No   Drug use: No    Home Medications Prior to Admission medications   Medication Sig Start Date End Date Taking? Authorizing Provider  lidocaine (LIDODERM) 5 % Place 1 patch onto the skin daily. Remove & Discard patch within 12 hours or as directed by MD 12/15/21  Yes Tacy Learn, PA-C  albuterol (PROVENTIL HFA;VENTOLIN HFA) 108 (90 BASE) MCG/ACT inhaler  Inhale 1-2 puffs into the lungs every 6 (six) hours as needed for wheezing. 10/30/12   Oliveri, Tia L, PA-C  albuterol (PROVENTIL) (2.5 MG/3ML) 0.083% nebulizer solution Take 2.5 mg by nebulization every 6 (six) hours as needed.    [provider]  diphenhydrAMINE (BENADRYL) 25 MG tablet Take 1 tablet (25 mg total) by mouth every 6 (six) hours. 02/08/14   Rancour, Annie Main, MD  EPINEPHrine (EPIPEN) 0.3 mg/0.3 mL SOAJ injection Inject 0.3 mLs (0.3 mg total) into the muscle as needed (for severe allergic reaction). 02/08/14   Rancour, Annie Main, MD  famotidine (PEPCID) 20 MG tablet Take 1 tablet (20 mg total) by mouth 2 (two) times daily. 02/02/14   Ashley [place], NP  famotidine (PEPCID) 20 MG tablet Take 1 tablet (20 mg total) by mouth 2 (two) times daily. 02/08/14   Rancour, Annie Main, MD  folic acid (FOLVITE) 1 MG tablet Take 1 mg by mouth daily.    [provider]  Gabapentin (NEURONTIN PO) Take by mouth.    [provider]  ibuprofen (ADVIL,MOTRIN) 400 MG tablet Take 1 tablet (400 mg total) by mouth every 6 (six) hours as needed for pain. 02/12/13   Varney Biles, MD  insulin detemir (LEVEMIR) 100 UNIT/ML injection Inject into the skin at bedtime.  [provider]  levETIRAcetam (KEPPRA) 500 MG tablet Take 500 mg by mouth 2 (two) times daily.    [provider]  levothyroxine (SYNTHROID, LEVOTHROID) 100 MCG tablet Take 100 mcg by mouth daily before breakfast.    [provider]  LISINOPRIL PO Take by mouth.    [provider]  metoprolol succinate (TOPROL-XL) 25 MG 24 hr tablet Take 25 mg by mouth daily.    [provider]  ondansetron (ZOFRAN) 8 MG tablet Take by mouth every 8 (eight) hours as needed for nausea or vomiting.    [provider]  oxyCODONE-acetaminophen (PERCOCET/ROXICET) 5-325 MG per tablet Take 1 tablet by mouth every 4 (four) hours as needed for pain. 02/12/13   Varney Biles, MD  PHENOBARBITAL PO Take by  mouth.    [provider]  potassium chloride (K-DUR) 10 MEQ tablet Take 2 tablets (20 mEq total) by mouth 2 (two) times daily. 07/21/14   Pamella Pert, MD  potassium chloride SA (K-DUR,KLOR-CON) 20 MEQ tablet Take 1 tablet (20 mEq total) by mouth daily. Take two tablets once. 10/30/12   Oliveri, Tia L, PA-C  Vitamin D, Ergocalciferol, (DRISDOL) 50000 UNITS CAPS Take 50,000 Units by mouth.    [provider]  WARFARIN SODIUM PO Take by mouth.    [provider]  propranolol (INDERAL) 10 MG tablet Take 10 mg by mouth 3 (three) times daily.  02/02/14  [provider]    Allergies    Latex, Lisinopril, and Vicodin [hydrocodone-acetaminophen]  Review of Systems   Review of Systems  Constitutional:  Negative for fever.  Respiratory:  Negative for shortness of breath.   Cardiovascular:  Negative for chest pain.  Gastrointestinal:  Positive for constipation. Negative for abdominal pain, nausea and vomiting.  Musculoskeletal:  Positive for arthralgias, back pain and joint swelling.  Skin:  Negative for rash and wound.  Allergic/Immunologic: Positive for immunocompromised state.  Neurological:  Negative for weakness and numbness.  Psychiatric/Behavioral:  Negative for confusion.   All other systems reviewed and are negative.  Physical Exam Updated Vital Signs BP (!) 144/61 (BP Location: Right Arm)    Pulse 81    Temp 98 F (36.7 C) (Oral)    Resp 18    Ht 5\' 5"  (1.651 m)    Wt 119 kg    SpO2 95%    BMI 43.66 kg/m   Physical Exam Vitals and nursing note reviewed.  Constitutional:      General: She is not in acute distress.    Appearance: She is well-developed. She is obese. She is not diaphoretic.  HENT:     Head: Normocephalic and atraumatic.  Cardiovascular:     Pulses: Normal pulses.  Pulmonary:     Effort: Pulmonary effort is normal.  Abdominal:     Palpations: Abdomen is soft.     Tenderness: There is no abdominal tenderness.   Musculoskeletal:     Lumbar back: Tenderness present.       Back:     Comments: Diffuse low back pain without step offs. TTP right 5th finger distal phalanx and DIP with ?chronic deformity of DIP. TTP left 3rd finger diffuse.  Skin:    General: Skin is warm and dry.     Findings: No erythema or rash.  Neurological:     Mental Status: She is alert and oriented to person, place, and time.  Psychiatric:        Behavior: Behavior normal.    ED Results /  Procedures / Treatments   Labs (all labs ordered are listed, but only abnormal results are displayed) Labs Reviewed - No data to display  EKG None  Radiology DG Lumbar Spine Complete  Result Date: 12/15/2021 CLINICAL DATA:  Fall, back pain EXAM: LUMBAR SPINE - COMPLETE 4+ VIEW COMPARISON:  None. FINDINGS: Limited study due to body habitus. No acute fracture or malalignment identified. Mild appearing facet arthropathy in the lower lumbar spine. Intervertebral disc spaces appear fairly well-preserved. IMPRESSION: No acute fracture visualized.  Limited study. Electronically Signed   By: Ofilia Neas M.D.   On: 12/15/2021 16:20   DG Finger Little Right  Result Date: 12/15/2021 CLINICAL DATA:  Fall.  Pain EXAM: RIGHT LITTLE FINGER 2+V COMPARISON:  None. FINDINGS: There is volar and radial subluxation of the DIP joint with overlying soft tissue swelling. Small os ossific densities adjacent to the head of the middle phalanx and base of the distal phalanx may reflect underlying fractures. Bony demineralization at the base of the distal phalanx with surrounding periosteal reaction is identified. IMPRESSION: 1. Volar and radial subluxation of the DIP joint with overlying soft tissue swelling. 2. There is bony demineralization involving the base of the distal phalanx with surrounding periosteal reaction. Underlying inflammatory or infectious arthropathy cannot be excluded. Recommend repeat imaging following relocation of the DIP joint.  Electronically Signed   By: Kerby Moors M.D.   On: 12/15/2021 16:23   DG Finger Middle Left  Result Date: 12/15/2021 CLINICAL DATA:  Left middle finger pain after fall 2 months ago. EXAM: LEFT MIDDLE FINGER 2+V COMPARISON:  None. FINDINGS: There is no evidence of fracture or dislocation. There is no evidence of arthropathy or other focal bone abnormality. Calcifications are seen in the soft tissues volar to the middle phalanx which may represent old inflammation or old traumatic injury. IMPRESSION: No acute abnormality seen. Electronically Signed   By: Marijo Conception M.D.   On: 12/15/2021 16:20    Procedures Procedures   Medications Ordered in ED Medications  Glycerin (Adult) 2.1 g suppository 1 suppository (1 suppository Rectal Given 12/15/21 1631)    ED Course  I have reviewed the triage vital signs and the nursing notes.  Pertinent labs & imaging results that were available during my care of the patient were reviewed by me and considered in my medical decision making (see chart for details).  Clinical Course as of 12/15/21 1731  Tue Dec 27, 893  [id number] 50 year old female presents for evaluation for a fall which occurred 2 months ago resulting in injury to her right fifth finger and left third finger as well as across her low back.  She is found to have deformity of the right distal fifth phalanx concerning for mallet finger, x-ray is concerning for possible fracture.  Plan is to place in a splint and follow-up with hand Ortho.  In regards to her left third finger, the x-ray is negative for fracture, she does have swelling with tenderness to this digit, will buddy tape.  Lumbar spine x-rays negative for acute fracture, will treat with Lidoderm patch and recommend follow-up with PCP, may benefit from physical therapy. Patient was given glycerin suppository while in the ER for her constipation with relief. [LM]    Clinical Course User Index [LM] Roque Lias   MDM  Rules/Calculators/A&P                            Final Clinical Impression(s) /  ED Diagnoses Final diagnoses:  Fall, initial encounter  Strain of lumbar region, initial encounter  Closed nondisplaced fracture of distal phalanx of right little finger, initial encounter  Sprain of left middle finger, unspecified site of digit, initial encounter  Constipation, unspecified constipation type    Rx / DC Orders ED Discharge Orders          Ordered    lidocaine (LIDODERM) 5 %  Every 24 hours        12/15/21 1724             Tacy Learn, PA-C 12/15/21 1731    Lucrezia Starch, MD 12/17/21 [phone]

## 2021-12-31 ENCOUNTER — Institutional Professional Consult (permissible substitution): Payer: Medicare Other | Admitting: Plastic Surgery

## 2022-05-20 DEATH — deceased

## 2022-06-12 IMAGING — DX DG FINGER MIDDLE 2+V*L*
3 series · 3 of 3 positions shown · non-contrast
Comparison: None.

CLINICAL DATA: Left middle finger pain after fall 2 months ago.

EXAM:
LEFT MIDDLE FINGER 2+V

[finger ap]
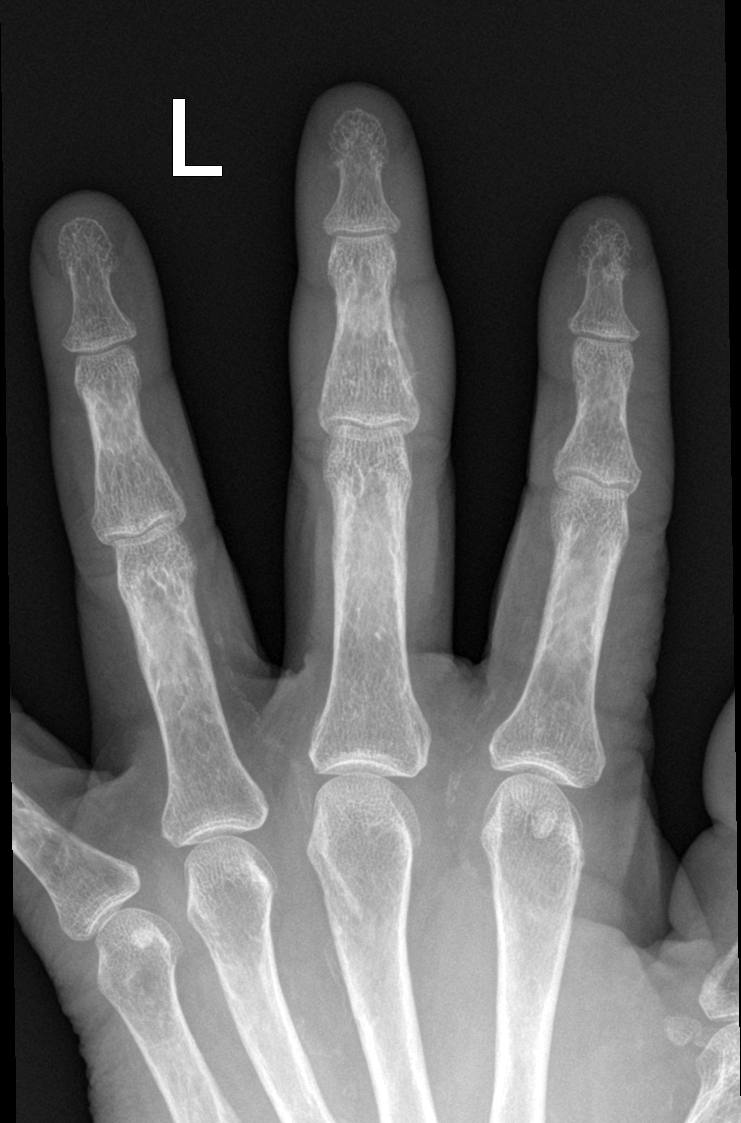

[finger obl]
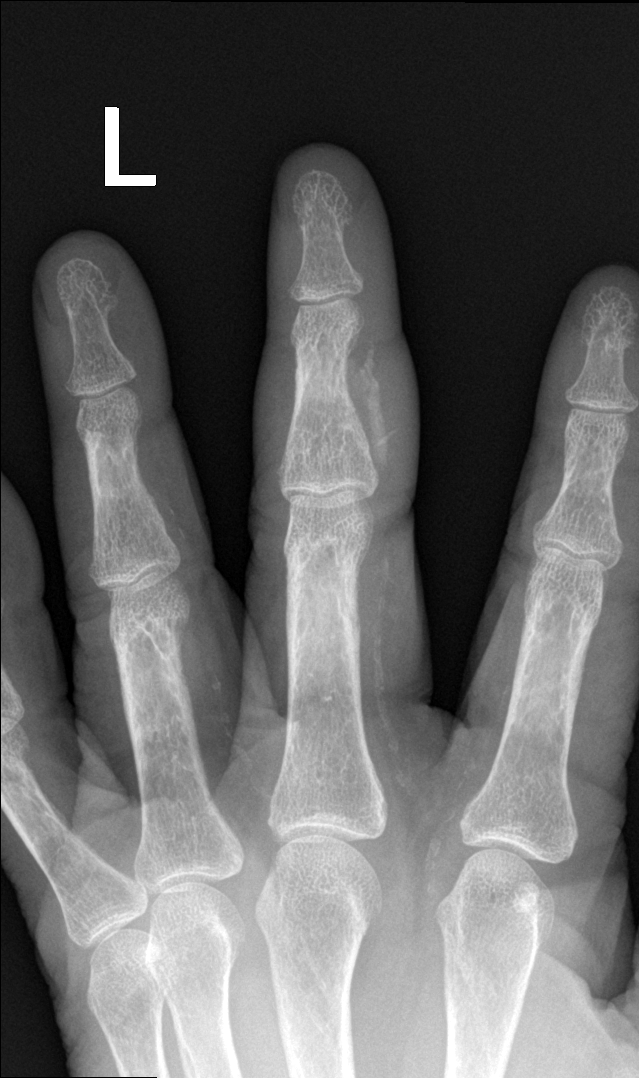

[finger lat]
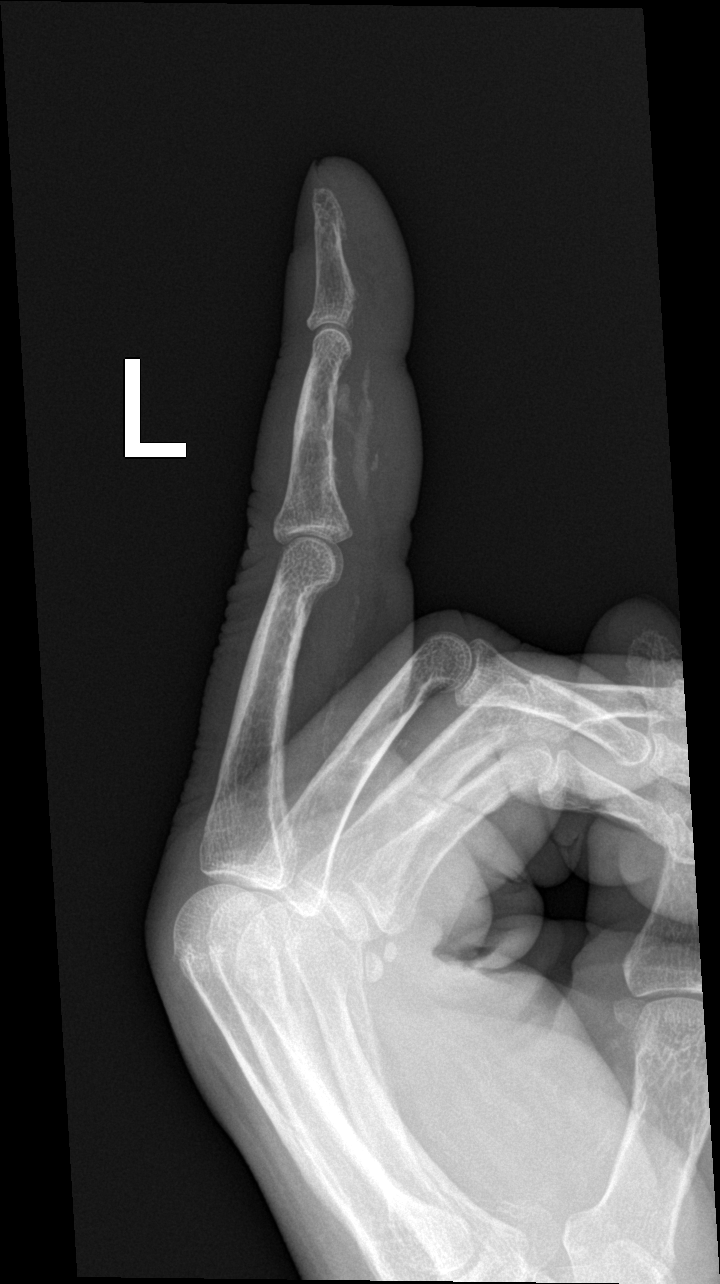

[3 of 3 positions shown; findings below may reference images not displayed]

FINDINGS: There is no evidence of fracture or dislocation. There is no
evidence of arthropathy or other focal bone abnormality.
Calcifications are seen in the soft tissues volar to the middle
phalanx which may represent old inflammation or old traumatic
injury.
IMPRESSION: No acute abnormality seen.
